# Patient Record
Sex: Male | Born: 1985 | Race: White | Hispanic: No | Marital: Married | Smoking: Never smoker
Health system: Southern US, Community
[De-identification: ages and names within clinical notes are randomized; demographics above are authoritative.]

## PROBLEM LIST (undated history)

## (undated) DIAGNOSIS — J45909 Unspecified asthma, uncomplicated: Secondary | ICD-10-CM

## (undated) DIAGNOSIS — E663 Overweight: Secondary | ICD-10-CM

## (undated) DIAGNOSIS — T7840XA Allergy, unspecified, initial encounter: Secondary | ICD-10-CM

## (undated) DIAGNOSIS — L989 Disorder of the skin and subcutaneous tissue, unspecified: Secondary | ICD-10-CM

## (undated) DIAGNOSIS — F419 Anxiety disorder, unspecified: Secondary | ICD-10-CM

## (undated) HISTORY — DX: Overweight: E66.3

## (undated) HISTORY — DX: Anxiety disorder, unspecified: F41.9

## (undated) HISTORY — DX: Unspecified asthma, uncomplicated: J45.909

## (undated) HISTORY — PX: HAND SURGERY: SHX662

## (undated) HISTORY — DX: Allergy, unspecified, initial encounter: T78.40XA

## (undated) HISTORY — DX: Disorder of the skin and subcutaneous tissue, unspecified: L98.9

---
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
## 2008-01-23 ENCOUNTER — Emergency Department (HOSPITAL_COMMUNITY): Admission: EM | Admit: 2008-01-23 | Discharge: 2008-01-23 | Payer: Self-pay | Admitting: Family Medicine

## 2009-11-04 IMAGING — CR DG ANKLE COMPLETE 3+V*L*
3 series · 3 of 3 positions shown · non-contrast
Comparison: None.

CLINICAL DATA: Injured left ankle.  Lateral pain and swelling.

LEFT ANKLE COMPLETE - 3+ VIEW 01/23/2008:

[view not recorded (1 of 3)]
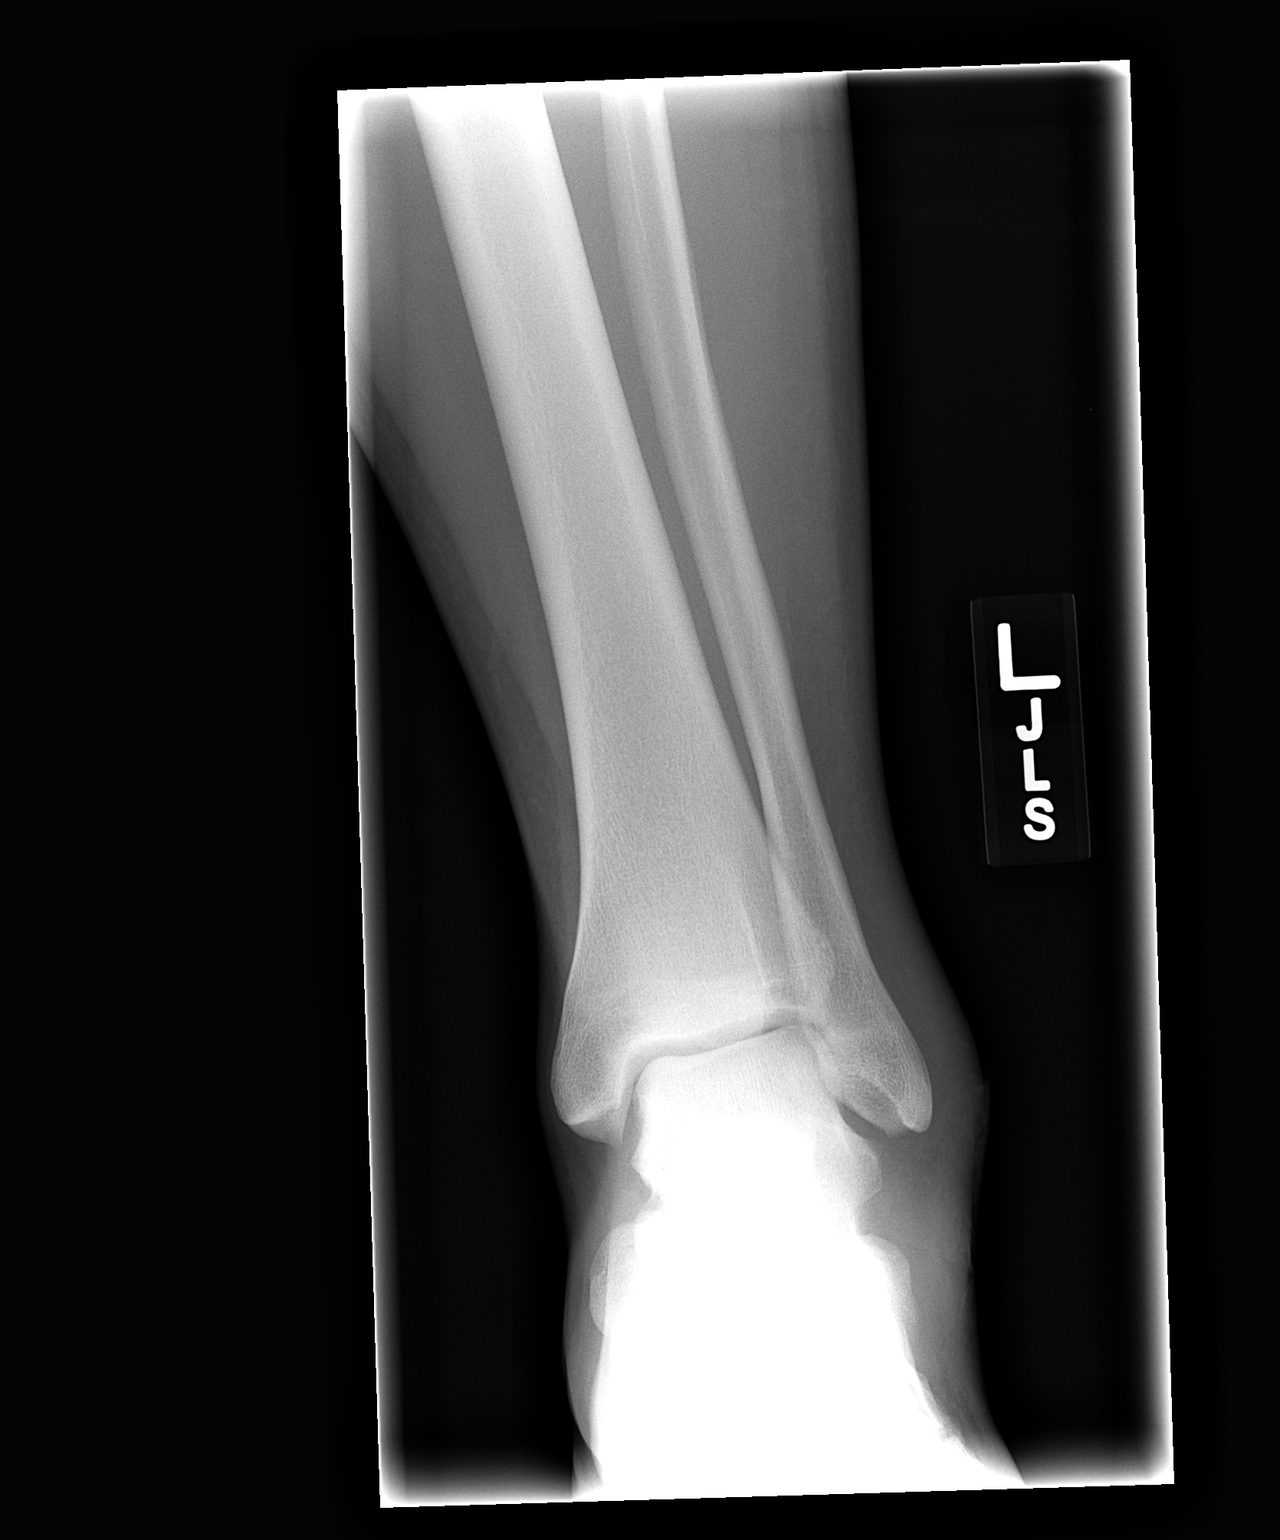

[view not recorded (2 of 3)]
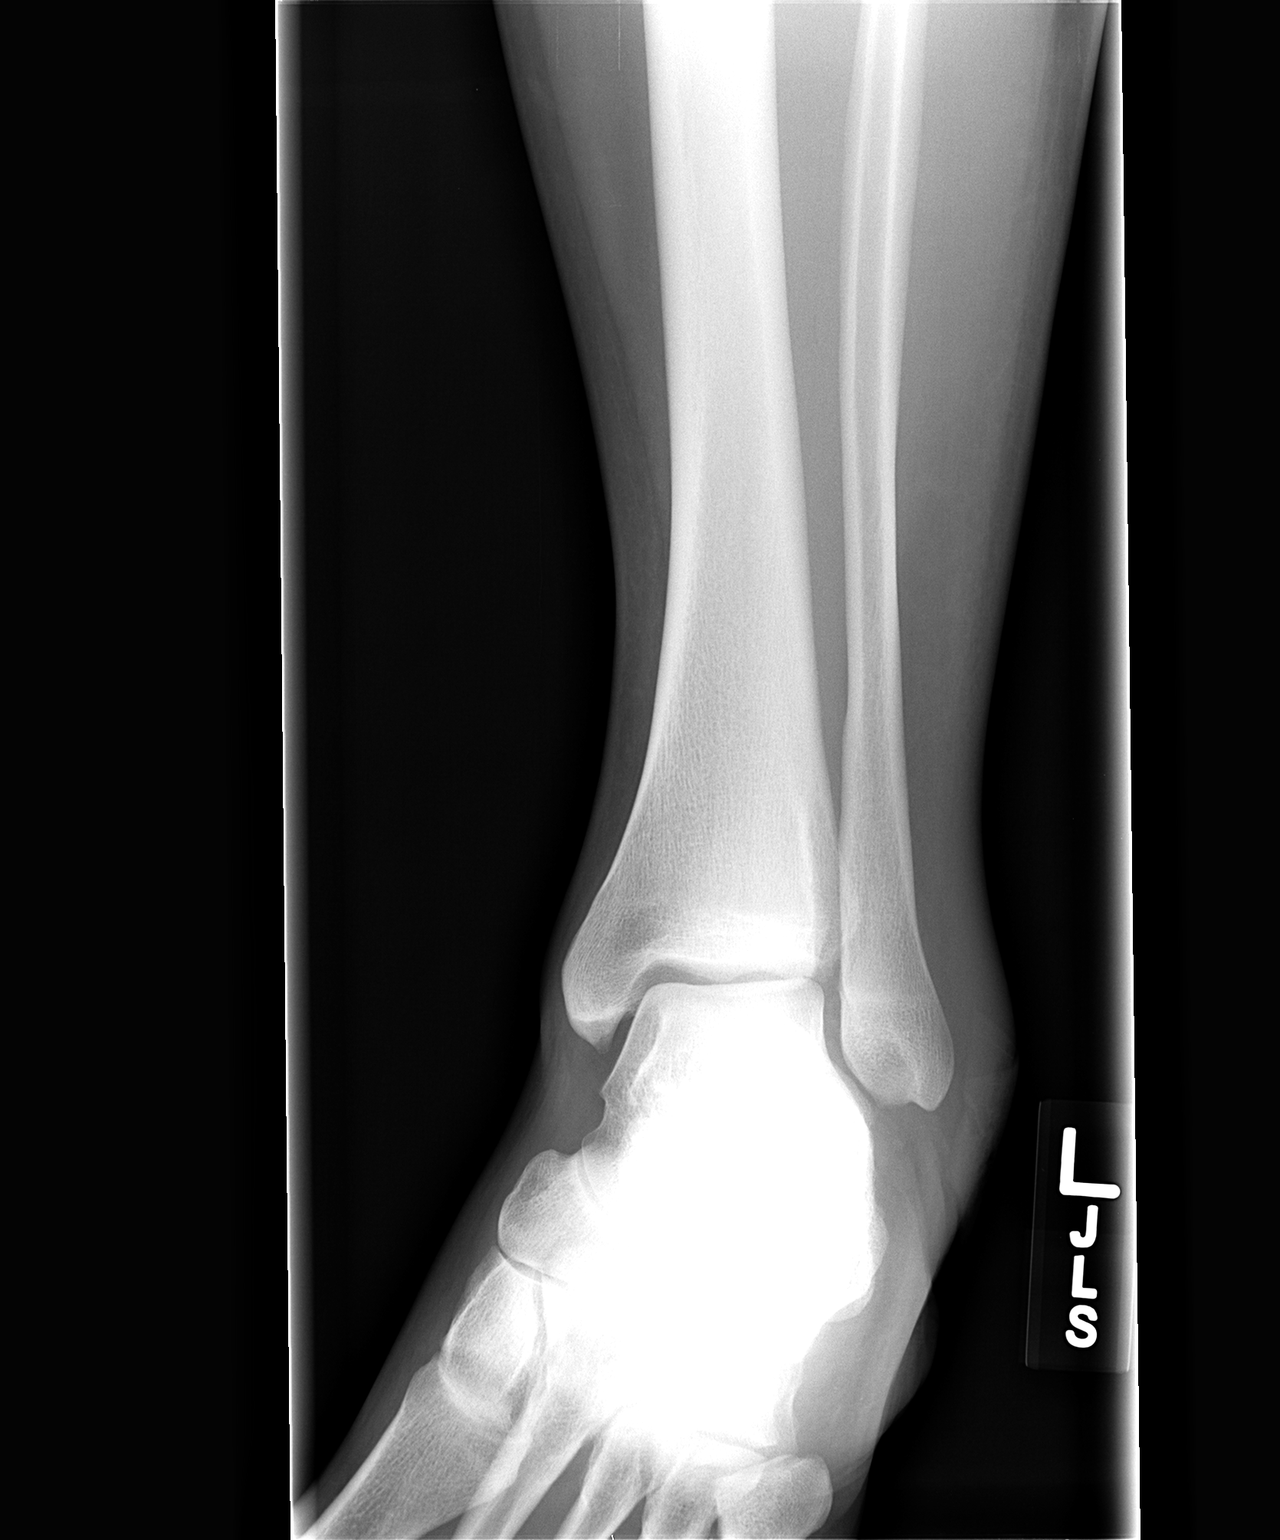

[view not recorded (3 of 3)]
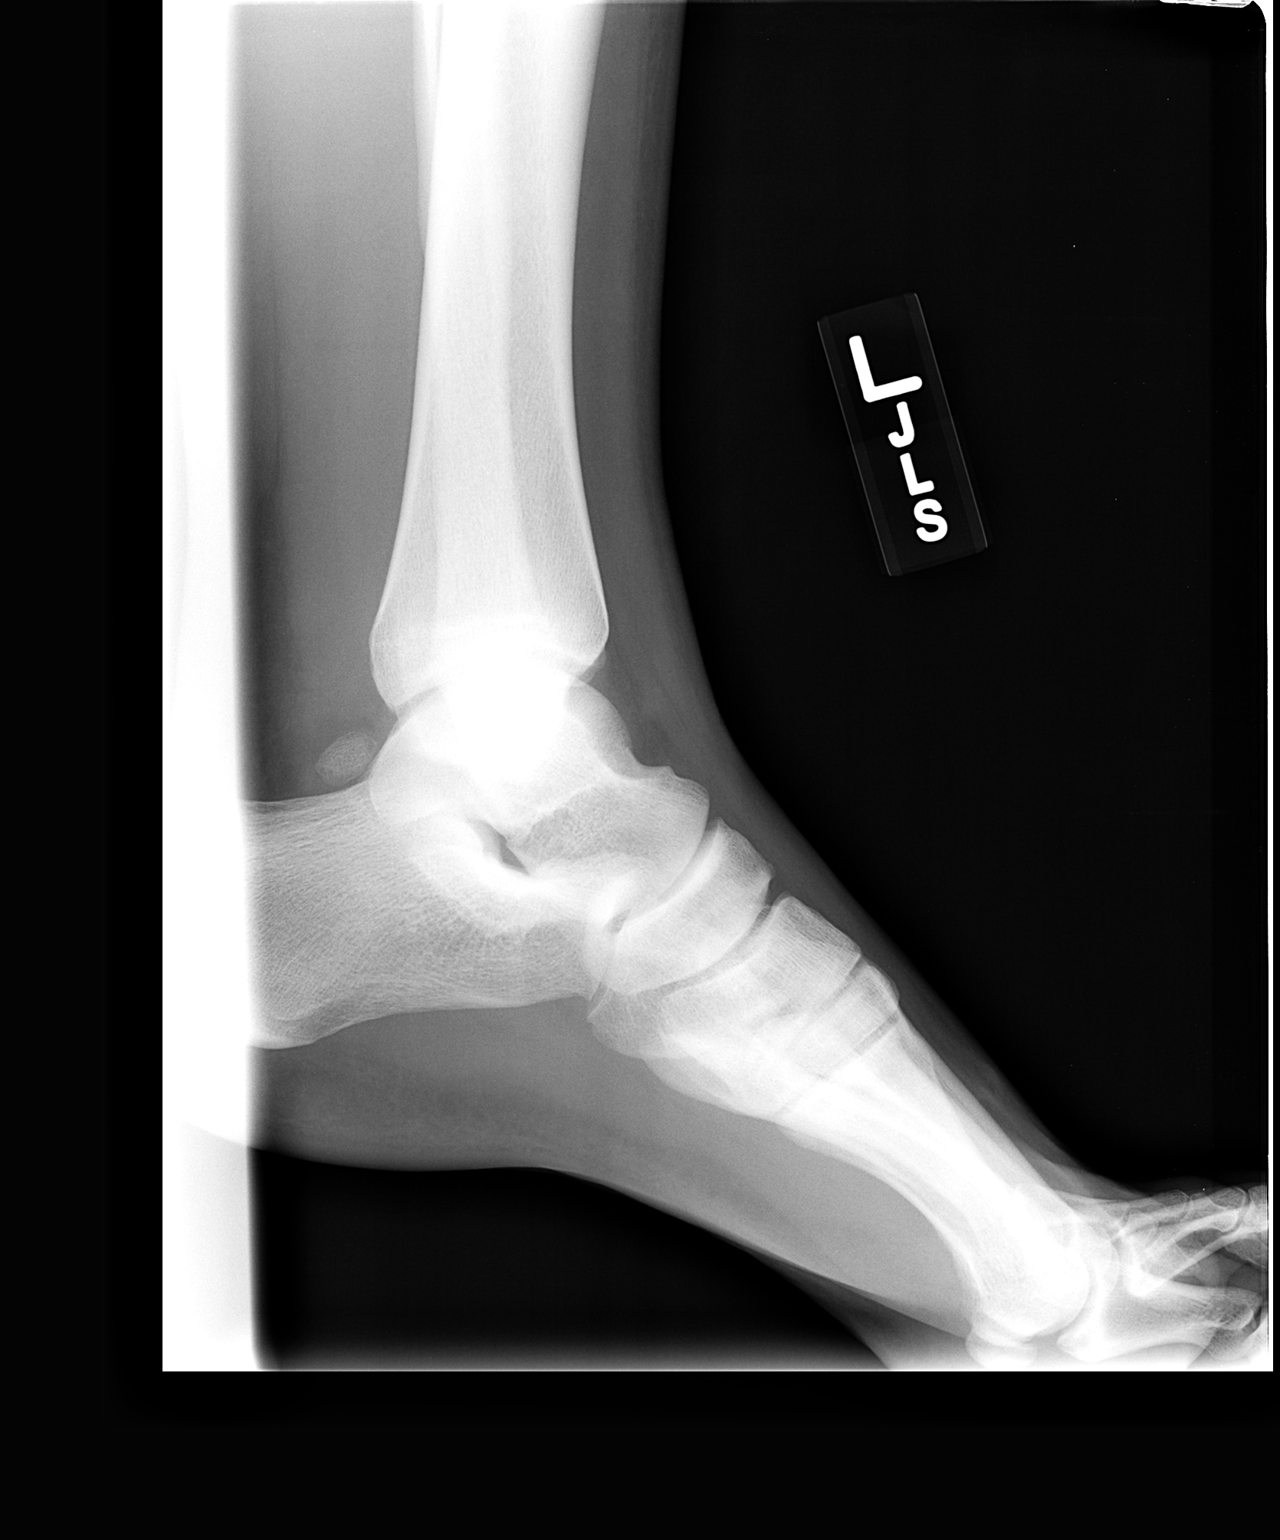

[3 of 3 positions shown; findings below may reference images not displayed]

FINDINGS: No evidence of acute fracture or dislocation.  Ankle
mortise intact with well-preserved joint space.  Accessory ossicle
posterior to the talus.  Lateral soft tissue swelling.  No evidence
of a significant joint effusion.
IMPRESSION: No acute skeletal abnormalities.  Lateral soft tissue swelling.

## 2010-10-11 NOTE — Op Note (Signed)
[place]  Patient:    Bruce Mckee, Bruce Mckee                        MRN: [id number] Proc. Date: 04/15/00 Adm. Date:  [id number] Attending:  Monica Becton                           Operative Report  REOPERATIVE DIAGNOSIS:  Phimosis.  POSTOPERATIVE DIAGNOSIS:  Phimosis.  OPERATION PERFORMED:  Circumcision.  SURGEON:  Claudette Laws, M.D.  ANESTHESIA:  DESCRIPTION OF PROCEDURE:  The patient was prepped in the supine position under LMA anesthesia.  A marking  pen was used to outline the incisions on the skin and the mucosal surfaces.  A knife blade was used to outline the incisions.  The foreskin was removed with Metzenbaum scissors leaving about 3 to 4 mm of the mucosa attached to the corona.  All bleeders were electrocoagulated.  Approximately 20 cc of a mixture of 0.25% plain Marcaine mixed with 1% plain Xylocaine was injected circumferentially for a penile block.  The shaft skin was then reapproximated to the mucosa with interrupted 3-0 chromic catgut.  Betadine ointment was applied to the incision along with circular gauze dressing and Coban dressing and he was taken back to the recovery room in satisfactory condition. DD:  04/15/00 TD:  04/15/00 Job: [id number]

## 2016-07-07 DIAGNOSIS — R509 Fever, unspecified: Secondary | ICD-10-CM | POA: Diagnosis not present

## 2016-07-26 DIAGNOSIS — J209 Acute bronchitis, unspecified: Secondary | ICD-10-CM | POA: Diagnosis not present

## 2016-10-04 DIAGNOSIS — J0100 Acute maxillary sinusitis, unspecified: Secondary | ICD-10-CM | POA: Diagnosis not present

## 2016-11-19 DIAGNOSIS — J40 Bronchitis, not specified as acute or chronic: Secondary | ICD-10-CM | POA: Diagnosis not present

## 2016-11-19 DIAGNOSIS — J329 Chronic sinusitis, unspecified: Secondary | ICD-10-CM | POA: Diagnosis not present

## 2016-11-19 DIAGNOSIS — Z6829 Body mass index (BMI) 29.0-29.9, adult: Secondary | ICD-10-CM | POA: Diagnosis not present

## 2017-08-28 DIAGNOSIS — J209 Acute bronchitis, unspecified: Secondary | ICD-10-CM | POA: Diagnosis not present

## 2017-08-28 DIAGNOSIS — Z6829 Body mass index (BMI) 29.0-29.9, adult: Secondary | ICD-10-CM | POA: Diagnosis not present

## 2017-08-28 DIAGNOSIS — Z1339 Encounter for screening examination for other mental health and behavioral disorders: Secondary | ICD-10-CM | POA: Diagnosis not present

## 2017-08-28 DIAGNOSIS — S058X2A Other injuries of left eye and orbit, initial encounter: Secondary | ICD-10-CM | POA: Diagnosis not present

## 2017-11-02 DIAGNOSIS — R109 Unspecified abdominal pain: Secondary | ICD-10-CM | POA: Diagnosis not present

## 2017-11-02 DIAGNOSIS — R1084 Generalized abdominal pain: Secondary | ICD-10-CM | POA: Diagnosis not present

## 2018-01-18 DIAGNOSIS — L578 Other skin changes due to chronic exposure to nonionizing radiation: Secondary | ICD-10-CM | POA: Diagnosis not present

## 2018-01-18 DIAGNOSIS — D225 Melanocytic nevi of trunk: Secondary | ICD-10-CM | POA: Diagnosis not present

## 2018-01-18 DIAGNOSIS — L720 Epidermal cyst: Secondary | ICD-10-CM | POA: Diagnosis not present

## 2018-01-18 DIAGNOSIS — L570 Actinic keratosis: Secondary | ICD-10-CM | POA: Diagnosis not present

## 2018-01-18 DIAGNOSIS — D2239 Melanocytic nevi of other parts of face: Secondary | ICD-10-CM | POA: Diagnosis not present

## 2018-02-24 DIAGNOSIS — Z6829 Body mass index (BMI) 29.0-29.9, adult: Secondary | ICD-10-CM | POA: Diagnosis not present

## 2018-02-24 DIAGNOSIS — J40 Bronchitis, not specified as acute or chronic: Secondary | ICD-10-CM | POA: Diagnosis not present

## 2018-06-19 DIAGNOSIS — J181 Lobar pneumonia, unspecified organism: Secondary | ICD-10-CM | POA: Diagnosis not present

## 2018-06-19 DIAGNOSIS — R05 Cough: Secondary | ICD-10-CM | POA: Diagnosis not present

## 2019-04-08 ENCOUNTER — Other Ambulatory Visit: Payer: Self-pay

## 2019-04-08 DIAGNOSIS — Z20822 Contact with and (suspected) exposure to covid-19: Secondary | ICD-10-CM

## 2019-04-11 LAB — NOVEL CORONAVIRUS, NAA: SARS-CoV-2, NAA: NOT DETECTED

## 2019-04-28 ENCOUNTER — Other Ambulatory Visit: Payer: Self-pay

## 2019-04-28 DIAGNOSIS — Z20822 Contact with and (suspected) exposure to covid-19: Secondary | ICD-10-CM

## 2019-04-29 DIAGNOSIS — H6591 Unspecified nonsuppurative otitis media, right ear: Secondary | ICD-10-CM | POA: Diagnosis not present

## 2019-04-29 DIAGNOSIS — Z20828 Contact with and (suspected) exposure to other viral communicable diseases: Secondary | ICD-10-CM | POA: Diagnosis not present

## 2019-05-01 LAB — NOVEL CORONAVIRUS, NAA: SARS-CoV-2, NAA: NOT DETECTED

## 2019-05-23 ENCOUNTER — Ambulatory Visit: Payer: BC Managed Care – PPO | Attending: Internal Medicine

## 2019-05-23 DIAGNOSIS — Z20822 Contact with and (suspected) exposure to covid-19: Secondary | ICD-10-CM

## 2019-05-23 DIAGNOSIS — Z20828 Contact with and (suspected) exposure to other viral communicable diseases: Secondary | ICD-10-CM | POA: Diagnosis not present

## 2019-05-24 DIAGNOSIS — H5712 Ocular pain, left eye: Secondary | ICD-10-CM | POA: Diagnosis not present

## 2019-05-25 LAB — NOVEL CORONAVIRUS, NAA: SARS-CoV-2, NAA: NOT DETECTED

## 2019-07-29 DIAGNOSIS — Z6831 Body mass index (BMI) 31.0-31.9, adult: Secondary | ICD-10-CM | POA: Diagnosis not present

## 2019-07-29 DIAGNOSIS — R079 Chest pain, unspecified: Secondary | ICD-10-CM | POA: Diagnosis not present

## 2019-07-29 DIAGNOSIS — D229 Melanocytic nevi, unspecified: Secondary | ICD-10-CM | POA: Diagnosis not present

## 2019-08-05 ENCOUNTER — Ambulatory Visit (INDEPENDENT_AMBULATORY_CARE_PROVIDER_SITE_OTHER): Payer: BC Managed Care – PPO | Admitting: Cardiology

## 2019-08-05 ENCOUNTER — Encounter: Payer: Self-pay | Admitting: Cardiology

## 2019-08-05 ENCOUNTER — Other Ambulatory Visit: Payer: Self-pay

## 2019-08-05 DIAGNOSIS — I319 Disease of pericardium, unspecified: Secondary | ICD-10-CM

## 2019-08-05 DIAGNOSIS — R0789 Other chest pain: Secondary | ICD-10-CM

## 2019-08-05 DIAGNOSIS — I300 Acute nonspecific idiopathic pericarditis: Secondary | ICD-10-CM

## 2019-08-05 HISTORY — DX: Disease of pericardium, unspecified: I31.9

## 2019-08-05 HISTORY — DX: Other chest pain: R07.89

## 2019-08-05 NOTE — Progress Notes (Signed)
Cardiology Consultation:    Date:  08/05/2019   ID:  Bruce Mckee, DOB 1986-03-05, MRN [id number]  PCP:  Angelina Sheriff, MD  Cardiologist:  Jenne Campus, MD   Referring MD: Angelina Sheriff, MD   No chief complaint on file.  chest pain  History of Present Illness:    Bruce Mckee is a 34 y.o. male who is being seen today for the evaluation of chest pain at the request of Angelina Sheriff, MD.  He is a young healthy gentleman.  For last few weeks has been experiencing chest pain.  Pain is atypical happening at rest lasting for about 2 hours and there is no shortness of breath no sweating associated with this sensation.  He blamed this on stressful life.  He is for history Freight forwarder.  He is working from required a lot of walking he has no difficulty doing it, however, lately he said when he was walking uphill he developed shortness of breath as well as some chest sensation.  He used to exercise on a regular basis but because of continued he does not do it anymore.  He does not smoke his cholesterol LDL is 93, HDL 56.  He does have only one family member his grandfather who ended up having myocardial infarction vision below 59.  Otherwise everybody is healthy.  Pain that he described in the chest that can last for hours taking deep breath coughing does not make it worse severity of the pain is about 3-4 in scale up to 10.  He was given alprazolam by his primary care physician with good relief.  History reviewed. No pertinent past medical history.  History reviewed. No pertinent surgical history.  Current Medications: Current Meds  Medication Sig  . ALPRAZolam (XANAX) 0.25 MG tablet Take 0.25 mg by mouth daily.     Allergies:   Amoxicillin and Doxycycline   Social History   Socioeconomic History  . Marital status: Single    Spouse name: Not on file  . Number of children: Not on file  . Years of education: Not on file  . Highest education level: Not on file    Occupational History  . Not on file  Tobacco Use  . Smoking status: Never Smoker  . Smokeless tobacco: Never Used  Substance and Sexual Activity  . Alcohol use: Not on file  . Drug use: Not on file  . Sexual activity: Not on file  Other Topics Concern  . Not on file  Social History Narrative  . Not on file   Social Determinants of Health   Financial Resource Strain:   . Difficulty of Paying Living Expenses:   Food Insecurity:   . Worried About Charity fundraiser in the Last Year:   . Arboriculturist in the Last Year:   Transportation Needs:   . Film/video editor (Medical):   Marland Kitchen Lack of Transportation (Non-Medical):   Physical Activity:   . Days of Exercise per Week:   . Minutes of Exercise per Session:   Stress:   . Feeling of Stress :   Social Connections:   . Frequency of Communication with Friends and Family:   . Frequency of Social Gatherings with Friends and Family:   . Attends Religious Services:   . Active Member of Clubs or Organizations:   . Attends Archivist Meetings:   Marland Kitchen Marital Status:      Family History: The patient's family  history includes Heart disease in his maternal grandfather; Heart failure in his maternal grandfather. ROS:   Please see the history of present illness.    All 14 point review of systems negative except as described per history of present illness.  EKGs/Labs/Other Studies Reviewed:    The following studies were reviewed today:   EKG:  EKG is  ordered today.  The ekg ordered today demonstrates normal sinus rhythm, normal PR interval, ST segment abnormalities indicating either early repolarization or possibly pericarditis.  Recent Labs: No results found for requested labs within last 8760 hours.  Recent Lipid Panel No results found for: CHOL, TRIG, HDL, CHOLHDL, VLDL, LDLCALC, LDLDIRECT  Physical Exam:    VS:  BP 122/78   Pulse 60   Ht 6\' 1"  (1.854 m)   Wt 239 lb 6.4 oz (108.6 kg)   SpO2 100%   BMI 31.59  kg/m     Wt Readings from Last 3 Encounters:  08/05/19 239 lb 6.4 oz (108.6 kg)     GEN:  Well nourished, well developed in no acute distress HEENT: Normal NECK: No JVD; No carotid bruits LYMPHATICS: No lymphadenopathy CARDIAC: RRR, no murmurs, no rubs, no gallops RESPIRATORY:  Clear to auscultation without rales, wheezing or rhonchi  ABDOMEN: Soft, non-tender, non-distended MUSCULOSKELETAL:  No edema; No deformity  SKIN: Warm and dry NEUROLOGIC:  Alert and oriented x 3 PSYCHIATRIC:  Normal affect   ASSESSMENT:    1. Atypical chest pain   2. Acute idiopathic pericarditis    PLAN:    In order of problems listed above:  1. Atypical chest pain.  Lasting for hours EKG showing some nonspecific changes possibly pericarditis.  Plan will be I will ask him to have sedimentation rate as well as C-reactive protein done if those tests are abnormal then we treat him as pericarditis.  If those tests are normal then will pursue ischemia work-up however, overall have low suspicion that this is contributing to his symptoms status.  I suspect anxiety plays some role here he is being given already anxiolytic in form of alprazolam which helps some.  If C-reactive protein and sed rate is negative then will pursue stress testing. 2. Possibly pericarditis.  I am not sure about the diagnosis yet.  Sed rate and C-reactive protein will be done. 3. Cholesterol profile reviewed with him.  LDL 93 HDL 56.  Needs a decent cholesterol profile.   Medication Adjustments/Labs and Tests Ordered: Current medicines are reviewed at length with the patient today.  Concerns regarding medicines are outlined above.  No orders of the defined types were placed in this encounter.  No orders of the defined types were placed in this encounter.   Signed, 10/05/19, MD, [facility]. 08/05/2019 9:22 AM    [facility]

## 2019-08-05 NOTE — Addendum Note (Signed)
Addended by: Lita Mains on: 08/05/2019 09:33 AM   Modules accepted: Orders

## 2019-08-05 NOTE — Patient Instructions (Signed)
Medication Instructions:  Your physician recommends that you continue on your current medications as directed. Please refer to the Current Medication list given to you today.  *If you need a refill on your cardiac medications before your next appointment, please call your pharmacy*   Lab Work: Your physician recommends that you return for lab work today: bmp, sed rate, crp    If you have labs (blood work) drawn today and your tests are completely normal, you will receive your results only by: Marland Kitchen MyChart Message (if you have MyChart) OR . A paper copy in the mail If you have any lab test that is abnormal or we need to change your treatment, we will call you to review the results.   Testing/Procedures: None    Follow-Up: At [facility], you and your health needs are our priority.  As part of our continuing mission to provide you with exceptional heart care, we have created designated Provider Care Teams.  These Care Teams include your primary Cardiologist (physician) and Advanced Practice Providers (APPs -  Physician Assistants and Nurse Practitioners) who all work together to provide you with the care you need, when you need it.  We recommend signing up for the patient portal called "MyChart".  Sign up information is provided on this After Visit Summary.  MyChart is used to connect with patients for Virtual Visits (Telemedicine).  Patients are able to view lab/test results, encounter notes, upcoming appointments, etc.  Non-urgent messages can be sent to your provider as well.   To learn more about what you can do with MyChart, go to [email or web address].    Your next appointment:   3 week(s)  The format for your next appointment:   In Person  Provider:   Gypsy Balsam, MD   Other Instructions

## 2019-08-06 LAB — COMPREHENSIVE METABOLIC PANEL
ALT: 22 IU/L (ref 0–44)
AST: 23 IU/L (ref 0–40)
Albumin/Globulin Ratio: 2.5 — ABNORMAL HIGH (ref 1.2–2.2)
Albumin: 4.7 g/dL (ref 4.0–5.0)
Alkaline Phosphatase: 52 IU/L (ref 39–117)
BUN/Creatinine Ratio: 10 (ref 9–20)
BUN: 10 mg/dL (ref 6–20)
Bilirubin Total: 0.6 mg/dL (ref 0.0–1.2)
CO2: 22 mmol/L (ref 20–29)
Calcium: 9.5 mg/dL (ref 8.7–10.2)
Chloride: 100 mmol/L (ref 96–106)
Creatinine, Ser: 1.01 mg/dL (ref 0.76–1.27)
GFR calc Af Amer: 112 mL/min/{1.73_m2} (ref >59)
GFR calc non Af Amer: 97 mL/min/{1.73_m2} (ref >59)
Globulin, Total: 1.9 g/dL (ref 1.5–4.5)
Glucose: 91 mg/dL (ref 65–99)
Potassium: 5 mmol/L (ref 3.5–5.2)
Sodium: 137 mmol/L (ref 134–144)
Total Protein: 6.6 g/dL (ref 6.0–8.5)

## 2019-08-06 LAB — HIGH SENSITIVITY CRP: CRP, High Sensitivity: 0.72 mg/L (ref 0.00–3.00)

## 2019-08-06 LAB — SEDIMENTATION RATE: Sed Rate: 2 mm/hr (ref 0–15)

## 2019-08-08 ENCOUNTER — Telehealth: Payer: Self-pay | Admitting: Emergency Medicine

## 2019-08-08 NOTE — Telephone Encounter (Signed)
Left message for patient to return call regarding test results.  

## 2019-08-09 ENCOUNTER — Telehealth: Payer: Self-pay | Admitting: Cardiology

## 2019-08-09 DIAGNOSIS — R0789 Other chest pain: Secondary | ICD-10-CM

## 2019-08-09 NOTE — Telephone Encounter (Signed)
New Message  Patient called back for his results.   Please call

## 2019-08-09 NOTE — Telephone Encounter (Signed)
Called patient back informed him of his test results and that Dr. Bing Matter advised him to have a treadmill stress test. Will arrange this for him in [place] and call with appointment tomorrow since schedulers are gone for the day. Patient verbally understands. No further questions.

## 2019-08-10 NOTE — Telephone Encounter (Signed)
Patient was scheduled for exercise tolerance test on 08/16/2019 at 330 pm. Left message for him to return call to give him this information as well as instructions and get him scheduled for a covid test.

## 2019-08-12 ENCOUNTER — Telehealth (HOSPITAL_COMMUNITY): Payer: Self-pay

## 2019-08-12 NOTE — Telephone Encounter (Signed)
Called patient. He had to cancel his exercise tolerance test for now because he can't miss work to be tested for covid before the test right now. He told me he will call us back when he is able to schedule it.

## 2019-08-12 NOTE — Telephone Encounter (Signed)
Encounter complete. 

## 2019-08-16 ENCOUNTER — Ambulatory Visit (HOSPITAL_COMMUNITY)
Admission: RE | Admit: 2019-08-16 | Payer: BC Managed Care – PPO | Source: Ambulatory Visit | Attending: Cardiology | Admitting: Cardiology

## 2019-08-25 DIAGNOSIS — Z1331 Encounter for screening for depression: Secondary | ICD-10-CM | POA: Diagnosis not present

## 2019-08-25 DIAGNOSIS — R0789 Other chest pain: Secondary | ICD-10-CM | POA: Diagnosis not present

## 2019-08-25 DIAGNOSIS — Z6831 Body mass index (BMI) 31.0-31.9, adult: Secondary | ICD-10-CM | POA: Diagnosis not present

## 2019-08-25 DIAGNOSIS — F419 Anxiety disorder, unspecified: Secondary | ICD-10-CM | POA: Diagnosis not present

## 2019-08-26 ENCOUNTER — Ambulatory Visit: Payer: BC Managed Care – PPO | Admitting: Cardiology

## 2019-09-06 ENCOUNTER — Telehealth: Payer: Self-pay | Admitting: Emergency Medicine

## 2019-09-06 NOTE — Telephone Encounter (Signed)
It is never absolutely necessary, will hold off with it and ask him to let us know if have more cp

## 2019-09-06 NOTE — Telephone Encounter (Signed)
Per Dr. Bing Matter doesn't need follow up if he doesn't want to. Will inform patient of this. Left message for patient to return call.

## 2019-09-06 NOTE — Telephone Encounter (Signed)
Called patient informed him per Dr. Bing Matter he doesn't have to have lexiscan. I canceled this for him and his covid test appointment that he had as well. Patient informed if he had anymore chest pain to let us know. He wants to know if Dr. Bing Matter still needs follow up appointment in May 2021. Will consult with Dr. Bing Matter.

## 2019-09-06 NOTE — Telephone Encounter (Signed)
Patient called and reports he is not going to be able to afford the upcoming stress test next week he doesn't think his insurance is going to cover it and he doesn't want to "rack up debt" if he doesn't have to. He denies having chest pain anymore and wanted to know if Dr. Bing Matter thinks he absolutely needs to stress test or not. If he can cancel he would prefer unless it is recommended otherwise.

## 2019-09-09 ENCOUNTER — Other Ambulatory Visit (HOSPITAL_COMMUNITY): Payer: BC Managed Care – PPO

## 2019-09-12 NOTE — Telephone Encounter (Signed)
No answer on mobile number

## 2019-09-13 ENCOUNTER — Encounter (HOSPITAL_COMMUNITY): Payer: BC Managed Care – PPO

## 2019-09-20 NOTE — Telephone Encounter (Signed)
Called patient and confirmed that he still wanted to cancel his upcoming with Dr. Bing Matter. He said yes. Appointment cancelled.

## 2019-09-30 ENCOUNTER — Ambulatory Visit: Payer: BC Managed Care – PPO | Admitting: Cardiology

## 2019-10-10 DIAGNOSIS — L720 Epidermal cyst: Secondary | ICD-10-CM | POA: Diagnosis not present

## 2019-10-10 DIAGNOSIS — D225 Melanocytic nevi of trunk: Secondary | ICD-10-CM | POA: Diagnosis not present

## 2019-10-10 DIAGNOSIS — L814 Other melanin hyperpigmentation: Secondary | ICD-10-CM | POA: Diagnosis not present

## 2020-01-06 DIAGNOSIS — Z3009 Encounter for other general counseling and advice on contraception: Secondary | ICD-10-CM | POA: Diagnosis not present

## 2020-01-26 DIAGNOSIS — J329 Chronic sinusitis, unspecified: Secondary | ICD-10-CM | POA: Diagnosis not present

## 2020-01-26 DIAGNOSIS — J40 Bronchitis, not specified as acute or chronic: Secondary | ICD-10-CM | POA: Diagnosis not present

## 2020-01-26 DIAGNOSIS — J302 Other seasonal allergic rhinitis: Secondary | ICD-10-CM | POA: Diagnosis not present

## 2020-02-24 DIAGNOSIS — Z302 Encounter for sterilization: Secondary | ICD-10-CM | POA: Diagnosis not present

## 2020-05-28 DIAGNOSIS — U071 COVID-19: Secondary | ICD-10-CM | POA: Diagnosis not present

## 2020-05-28 DIAGNOSIS — Z20822 Contact with and (suspected) exposure to covid-19: Secondary | ICD-10-CM | POA: Diagnosis not present

## 2020-10-10 DIAGNOSIS — R0602 Shortness of breath: Secondary | ICD-10-CM | POA: Diagnosis not present

## 2020-10-10 DIAGNOSIS — R0781 Pleurodynia: Secondary | ICD-10-CM | POA: Diagnosis not present

## 2020-10-10 DIAGNOSIS — R079 Chest pain, unspecified: Secondary | ICD-10-CM | POA: Diagnosis not present

## 2021-03-13 DIAGNOSIS — J40 Bronchitis, not specified as acute or chronic: Secondary | ICD-10-CM | POA: Diagnosis not present

## 2021-03-13 DIAGNOSIS — Z20828 Contact with and (suspected) exposure to other viral communicable diseases: Secondary | ICD-10-CM | POA: Diagnosis not present

## 2021-03-13 DIAGNOSIS — J329 Chronic sinusitis, unspecified: Secondary | ICD-10-CM | POA: Diagnosis not present

## 2021-07-17 DIAGNOSIS — J40 Bronchitis, not specified as acute or chronic: Secondary | ICD-10-CM | POA: Diagnosis not present

## 2021-07-17 DIAGNOSIS — J028 Acute pharyngitis due to other specified organisms: Secondary | ICD-10-CM | POA: Diagnosis not present

## 2021-07-22 DIAGNOSIS — H1031 Unspecified acute conjunctivitis, right eye: Secondary | ICD-10-CM | POA: Diagnosis not present

## 2021-10-07 DIAGNOSIS — R051 Acute cough: Secondary | ICD-10-CM | POA: Diagnosis not present

## 2021-10-07 DIAGNOSIS — J9801 Acute bronchospasm: Secondary | ICD-10-CM | POA: Diagnosis not present

## 2021-10-07 DIAGNOSIS — J019 Acute sinusitis, unspecified: Secondary | ICD-10-CM | POA: Diagnosis not present

## 2022-04-01 DIAGNOSIS — D235 Other benign neoplasm of skin of trunk: Secondary | ICD-10-CM | POA: Diagnosis not present

## 2022-04-01 DIAGNOSIS — L720 Epidermal cyst: Secondary | ICD-10-CM | POA: Diagnosis not present

## 2022-04-01 DIAGNOSIS — D225 Melanocytic nevi of trunk: Secondary | ICD-10-CM | POA: Diagnosis not present

## 2022-04-01 DIAGNOSIS — B078 Other viral warts: Secondary | ICD-10-CM | POA: Diagnosis not present

## 2022-04-01 DIAGNOSIS — L738 Other specified follicular disorders: Secondary | ICD-10-CM | POA: Diagnosis not present

## 2022-11-13 ENCOUNTER — Ambulatory Visit: Payer: BC Managed Care – PPO | Attending: Cardiology | Admitting: Cardiology

## 2022-11-13 ENCOUNTER — Ambulatory Visit: Payer: BC Managed Care – PPO | Attending: Cardiology

## 2022-11-13 ENCOUNTER — Encounter: Payer: Self-pay | Admitting: Cardiology

## 2022-11-13 VITALS — BP 122/80 | HR 65 | Ht 73.0 in | Wt 245.2 lb

## 2022-11-13 DIAGNOSIS — R0789 Other chest pain: Secondary | ICD-10-CM

## 2022-11-13 DIAGNOSIS — I300 Acute nonspecific idiopathic pericarditis: Secondary | ICD-10-CM | POA: Diagnosis not present

## 2022-11-13 DIAGNOSIS — R0609 Other forms of dyspnea: Secondary | ICD-10-CM

## 2022-11-13 HISTORY — DX: Other forms of dyspnea: R06.09

## 2022-11-13 LAB — EXERCISE TOLERANCE TEST
Angina Index: 0
Estimated workload: 13.4
Exercise duration (min): 12 min
Exercise duration (sec): 0 s
MPHR: 183 {beats}/min
Peak HR: 173 {beats}/min
Percent HR: 94 %
Rest HR: 64 {beats}/min

## 2022-11-13 NOTE — Addendum Note (Signed)
Addended by: Roxanne Mins I on: 11/13/2022 09:30 AM   Modules accepted: Orders

## 2022-11-13 NOTE — Progress Notes (Signed)
Cardiology Consultation:    Date:  11/13/2022   ID:  Bruce Mckee, DOB 12-01-1985, MRN [id number]  PCP:  Noni Saupe, MD  Cardiologist:  Gypsy Balsam, MD   Referring MD: Noni Saupe, MD   No chief complaint on file. Not having chest pain  History of Present Illness:    Bruce Mckee is a 37 y.o. male who is being seen today for the evaluation of chest pain at the request of Arvin Collard II, MD. I see him more than 3 years ago the reason for that visit at that time was for chest pain.  Pain got some pleuritic characteristic and at that time we decided to dissemination rate as well as high-sensitivity C-reactive protein with intention if it will be abnormal we will treat as pericarditis if no ischemia workup will be done.  All test came negative he had normal inflammatory markers he was scheduled to have a stress test but never happened.  He comes back to our office to be established as a patient again.  He complained of having some chest heaviness that happen typically nervous situation.  He is working required walking he have no difficulty walking climbing stairs but just concerned about his symptoms and would like to be checked.  There is no palpitation no swelling of lower extremities overall he is look like a young healthy man.  Does not smoke does not know what his cholesterol is does have family history of premature coronary artery disease  History reviewed. No pertinent past medical history.  History reviewed. No pertinent surgical history.  Current Medications: No outpatient medications have been marked as taking for the 11/13/22 encounter (Office Visit) with Georgeanna Lea, MD.     Allergies:   Amoxicillin and Doxycycline   Social History   Socioeconomic History   Marital status: Single    Spouse name: Not on file   Number of children: Not on file   Years of education: Not on file   Highest education level: Not on file  Occupational History    Not on file  Tobacco Use   Smoking status: Never   Smokeless tobacco: Never  Substance and Sexual Activity   Alcohol use: Not on file   Drug use: Not on file   Sexual activity: Not on file  Other Topics Concern   Not on file  Social History Narrative   Not on file   Social Determinants of Health   Financial Resource Strain: Not on file  Food Insecurity: Not on file  Transportation Needs: Not on file  Physical Activity: Not on file  Stress: Not on file  Social Connections: Not on file     Family History: The patient's family history includes Heart disease in his maternal grandfather; Heart failure in his maternal grandfather. ROS:   Please see the history of present illness.    All 14 point review of systems negative except as described per history of present illness.  EKGs/Labs/Other Studies Reviewed:    The following studies were reviewed today:   EKG:  EKG is  ordered today.  The ekg ordered today demonstrates normal sinus rhythm, normal P interval, normal QS complex duration morphology no ST segment changes  Recent Labs: No results found for requested labs within last 365 days.  Recent Lipid Panel No results found for: "CHOL", "TRIG", "HDL", "CHOLHDL", "VLDL", "LDLCALC", "LDLDIRECT"  Physical Exam:    VS:  BP 122/80 (BP Location: Right Arm, Patient  Position: Sitting, Cuff Size: Normal)   Pulse 65   Ht 6\' 1"  (1.854 m)   Wt 245 lb 3.2 oz (111.2 kg)   SpO2 98%   BMI 32.35 kg/m     Wt Readings from Last 3 Encounters:  11/13/22 245 lb 3.2 oz (111.2 kg)  08/05/19 239 lb 6.4 oz (108.6 kg)     GEN:  Well nourished, well developed in no acute distress HEENT: Normal NECK: No JVD; No carotid bruits LYMPHATICS: No lymphadenopathy CARDIAC: RRR, no murmurs, no rubs, no gallops RESPIRATORY:  Clear to auscultation without rales, wheezing or rhonchi  ABDOMEN: Soft, non-tender, non-distended MUSCULOSKELETAL:  No edema; No deformity  SKIN: Warm and dry NEUROLOGIC:   Alert and oriented x 3 PSYCHIATRIC:  Normal affect   ASSESSMENT:    1. Atypical chest pain   2. Dyspnea on exertion   3. Acute idiopathic pericarditis    PLAN:    In order of problems listed above:  Atypical chest pain.  Does have some worrisome characteristics.  Therefore, I think will be reasonable to do stress test.  I will schedule him to have plan EKG treadmill stress test. Overall risk assessment will do fasting lipid profile as well as LP(a).  We did spend great of time talking about need to exercise on the regular basis I recommended at least 5 times a week at least 30 minutes moderate intensity exercise, we also talk about basic of Mediterranean diet. History of pericarditis doubt this diagnosis he is inflammatory markers me years ago were negative so we will take away this diagnosis. [place] exertion overall very mild we will see how he will do on the treadmill.   Medication Adjustments/Labs and Tests Ordered: Current medicines are reviewed at length with the patient today.  Concerns regarding medicines are outlined above.  No orders of the defined types were placed in this encounter.  No orders of the defined types were placed in this encounter.   Signed, Georgeanna Lea, MD, [facility] 11/13/2022 9:12 AM    [facility]

## 2022-11-13 NOTE — Patient Instructions (Signed)
Medication Instructions:  Your physician recommends that you continue on your current medications as directed. Please refer to the Current Medication list given to you today.  *If you need a refill on your cardiac medications before your next appointment, please call your pharmacy*   Lab Work: Your physician recommends that you return for lab work in:   Labs today: Lipids, LPa  If you have labs (blood work) drawn today and your tests are completely normal, you will receive your results only by: MyChart Message (if you have MyChart) OR A paper copy in the mail If you have any lab test that is abnormal or we need to change your treatment, we will call you to review the results.   Testing/Procedures: Regular exercise stress test  Follow-Up: At [facility], you and your health needs are our priority.  As part of our continuing mission to provide you with exceptional heart care, we have created designated Provider Care Teams.  These Care Teams include your primary Cardiologist (physician) and Advanced Practice Providers (APPs -  Physician Assistants and Nurse Practitioners) who all work together to provide you with the care you need, when you need it.  We recommend signing up for the patient portal called "MyChart".  Sign up information is provided on this After Visit Summary.  MyChart is used to connect with patients for Virtual Visits (Telemedicine).  Patients are able to view lab/test results, encounter notes, upcoming appointments, etc.  Non-urgent messages can be sent to your provider as well.   To learn more about what you can do with MyChart, go to [email or web address].    Your next appointment:   2 month(s)  Provider:   Gypsy Balsam, MD    Other Instructions None

## 2022-11-15 LAB — LIPID PANEL
Chol/HDL Ratio: 3.7 ratio (ref 0.0–5.0)
Cholesterol, Total: 165 mg/dL (ref 100–199)
HDL: 45 mg/dL (ref >39)
LDL Chol Calc (NIH): 103 mg/dL — ABNORMAL HIGH (ref 0–99)
Triglycerides: 89 mg/dL (ref 0–149)
VLDL Cholesterol Cal: 17 mg/dL (ref 5–40)

## 2022-11-15 LAB — LIPOPROTEIN A (LPA): Lipoprotein (a): 23.4 nmol/L (ref <75.0)

## 2022-11-21 ENCOUNTER — Telehealth: Payer: Self-pay | Admitting: Cardiology

## 2022-11-21 ENCOUNTER — Telehealth: Payer: Self-pay

## 2022-11-21 NOTE — Telephone Encounter (Signed)
-----   Message from Georgeanna Lea, MD sent at 11/21/2022  9:32 AM EDT ----- Cholesterol mildly elevated but acceptable will wait for results of stress test

## 2022-11-21 NOTE — Telephone Encounter (Signed)
Patient notified of results.

## 2022-11-21 NOTE — Telephone Encounter (Signed)
Pt returning call regarding results. Please advise 

## 2022-11-25 DIAGNOSIS — B9689 Other specified bacterial agents as the cause of diseases classified elsewhere: Secondary | ICD-10-CM | POA: Diagnosis not present

## 2022-11-25 DIAGNOSIS — J329 Chronic sinusitis, unspecified: Secondary | ICD-10-CM | POA: Diagnosis not present

## 2023-01-12 NOTE — Progress Notes (Unsigned)
  Cardiology Office Note:  .   Date:  01/13/2023  ID:  [name], DOB 08-10-1985, MRN [id number] PCP: Noni Saupe, MD  [facility] Providers Cardiologist:  None    History of Present Illness: .   Bruce Mckee is a 37 y.o. male with a past medical history of atypical chest pain  Evaluated by Dr. Bing Matter on 11/13/2022, he continued to have some atypical type chest pain.  An exercise tolerance test was arranged which was negative for ischemia.  Lipoprotein a was obtained 23.4, lipid profile revealed LDL elevated at 103.  He presents today for follow-up of his test results.  He offers no formal complaints, has not had any further episodes of chest pain.  He feels like he was under a high level of stress when he was experiencing the pain, no further recurrence.  We talked about his cholesterol, overall it looks great, LDL is mildly elevated and we discussed ways he could get this down with diet and exercise. He denies chest pain, palpitations, dyspnea, pnd, orthopnea, n, v, dizziness, syncope, edema, weight gain, or early satiety.   ROS: Review of Systems  All other systems reviewed and are negative.    Studies Reviewed: .        Cardiac Studies & Procedures     STRESS TESTS  EXERCISE TOLERANCE TEST (ETT) 11/13/2022  Narrative   ETT negative for evidence of inducible ischemia. Good exercise capacity.              Risk Assessment/Calculations:     HYPERTENSION CONTROL Vitals:   01/13/23 0848 01/13/23 0912  BP: (!) 120/94 (!) 120/94    The patient's blood pressure is elevated above target today.  In order to address the patient's elevated BP: Blood pressure will be monitored at home to determine if medication changes need to be made.          Physical Exam:   VS:  BP (!) 120/94   Pulse 60   Ht 6\' 1"  (1.854 m)   Wt 252 lb (114.3 kg)   SpO2 99%   BMI 33.25 kg/m    Wt Readings from Last 3 Encounters:  01/13/23 252 lb (114.3 kg)  11/13/22 245 lb 3.2 oz  (111.2 kg)  08/05/19 239 lb 6.4 oz (108.6 kg)    GEN: Well nourished, well developed in no acute distress NECK: No JVD; No carotid bruits CARDIAC: RRR, no murmurs, rubs, gallops RESPIRATORY:  Clear to auscultation without rales, wheezing or rhonchi  ABDOMEN: Soft, non-tender, non-distended EXTREMITIES:  No edema; No deformity   ASSESSMENT AND PLAN: .   Atypical chest pain -patient feels it was most likely related to high level of stress that was going on, currently quiescent.  ETT was negative.  Lipoprotein a was 23. Dyslipidemia - cholesterol looks good, LDL is mildly elevated, however he can easily get this to the desired range with lifestyle modifications.          Dispo: Follow up PRN.   Signed, Flossie Dibble, NP

## 2023-01-13 ENCOUNTER — Ambulatory Visit: Payer: BC Managed Care – PPO | Attending: Cardiology | Admitting: Cardiology

## 2023-01-13 ENCOUNTER — Encounter: Payer: Self-pay | Admitting: *Deleted

## 2023-01-13 VITALS — BP 120/94 | HR 60 | Ht 73.0 in | Wt 252.0 lb

## 2023-01-13 DIAGNOSIS — R0789 Other chest pain: Secondary | ICD-10-CM | POA: Diagnosis not present

## 2023-01-13 DIAGNOSIS — E782 Mixed hyperlipidemia: Secondary | ICD-10-CM

## 2023-01-13 NOTE — Patient Instructions (Signed)
Medication Instructions:  Your physician recommends that you continue on your current medications as directed. Please refer to the Current Medication list given to you today.  *If you need a refill on your cardiac medications before your next appointment, please call your pharmacy*   Lab Work: None Ordered If you have labs (blood work) drawn today and your tests are completely normal, you will receive your results only by: MyChart Message (if you have MyChart) OR A paper copy in the mail If you have any lab test that is abnormal or we need to change your treatment, we will call you to review the results.   Testing/Procedures: None Ordered   Follow-Up: At [facility], you and your health needs are our priority.  As part of our continuing mission to provide you with exceptional heart care, we have created designated Provider Care Teams.  These Care Teams include your primary Cardiologist (physician) and Advanced Practice Providers (APPs -  Physician Assistants and Nurse Practitioners) who all work together to provide you with the care you need, when you need it.  We recommend signing up for the patient portal called "MyChart".  Sign up information is provided on this After Visit Summary.  MyChart is used to connect with patients for Virtual Visits (Telemedicine).  Patients are able to view lab/test results, encounter notes, upcoming appointments, etc.  Non-urgent messages can be sent to your provider as well.   To learn more about what you can do with MyChart, go to [email or web address].    Your next appointment:   As Needed  The format for your next appointment:   In Person  Provider:   Wallis Bamberg, NP   Other Instructions NA

## 2023-04-02 DIAGNOSIS — D229 Melanocytic nevi, unspecified: Secondary | ICD-10-CM | POA: Diagnosis not present

## 2023-04-02 DIAGNOSIS — D225 Melanocytic nevi of trunk: Secondary | ICD-10-CM | POA: Diagnosis not present

## 2023-04-02 DIAGNOSIS — L720 Epidermal cyst: Secondary | ICD-10-CM | POA: Diagnosis not present

## 2023-04-02 DIAGNOSIS — D235 Other benign neoplasm of skin of trunk: Secondary | ICD-10-CM | POA: Diagnosis not present

## 2023-04-30 ENCOUNTER — Ambulatory Visit (HOSPITAL_BASED_OUTPATIENT_CLINIC_OR_DEPARTMENT_OTHER)
Admission: EM | Admit: 2023-04-30 | Discharge: 2023-04-30 | Disposition: A | Discharge status: Home / Self Care | Payer: BC Managed Care – PPO | Attending: Internal Medicine | Admitting: Internal Medicine

## 2023-04-30 ENCOUNTER — Other Ambulatory Visit: Payer: Self-pay

## 2023-04-30 ENCOUNTER — Encounter (HOSPITAL_BASED_OUTPATIENT_CLINIC_OR_DEPARTMENT_OTHER): Payer: Self-pay | Admitting: Emergency Medicine

## 2023-04-30 DIAGNOSIS — Z8679 Personal history of other diseases of the circulatory system: Secondary | ICD-10-CM | POA: Diagnosis not present

## 2023-04-30 DIAGNOSIS — B349 Viral infection, unspecified: Secondary | ICD-10-CM | POA: Diagnosis not present

## 2023-04-30 DIAGNOSIS — J040 Acute laryngitis: Secondary | ICD-10-CM

## 2023-04-30 MED ORDER — PROMETHAZINE-DM 6.25-15 MG/5ML PO SYRP: 5.0000 mL | ORAL_SOLUTION | Freq: Four times a day (QID) | ORAL | 0 refills | Status: AC | PRN

## 2023-04-30 NOTE — ED Provider Notes (Signed)
[facility]    CSN: [id number] Arrival date & time: 04/30/23  1603      History   Chief Complaint Chief Complaint  Patient presents with   Nasal Congestion   Cough    HPI Bruce Mckee is a 37 y.o. male.   The history is provided by the patient.  Cough Associated symptoms: ear pain, rhinorrhea and shortness of breath   Associated symptoms: no chest pain, no fever, no myalgias, no sore throat and no wheezing   Not feeling well for 3 days symptoms include cough, nasal congestion, fatigue, sinus pain and pressure in ears.  Admits hoarse voice.  He has been taking Mucinex and Sudafed without relief.  He works as a Building services engineer, while walking in [employer] today he felt a little short of breath. Admits feeling sweaty now.  Denies documented fever, chills, body aches, chest pain, palpitations, dizziness or lightheadedness, sore throat, swollen glands, headache. Has 2 small children who have not been ill. Past medical history includes pericarditis, atypical chest pain, was seen by cardiology earlier this year, normal exercise tolerance test June 2024.  Past Medical History:  Diagnosis Date   Atypical chest pain 08/05/2019   Dyspnea on exertion 11/13/2022   Pericarditis 08/05/2019    Patient Active Problem List   Diagnosis Date Noted   Dyspnea on exertion 11/13/2022   Atypical chest pain 08/05/2019   Pericarditis 08/05/2019    Past Surgical History:  Procedure Laterality Date   HAND SURGERY Left        Home Medications    Prior to Admission medications   Not on File    Family History Family History  Problem Relation Age of Onset   Heart disease Maternal Grandfather    Heart failure Maternal Grandfather     Social History Social History   Tobacco Use   Smoking status: Never   Smokeless tobacco: Never     Allergies   Amoxicillin and Doxycycline   Review of Systems Review of Systems  Constitutional:  Positive for fatigue. Negative for appetite  change and fever.  HENT:  Positive for congestion, ear pain, rhinorrhea, sinus pressure, sinus pain and voice change. Negative for sore throat and trouble swallowing.   Respiratory:  Positive for cough and shortness of breath. Negative for wheezing.   Cardiovascular:  Negative for chest pain.  Gastrointestinal:  Negative for abdominal distention, nausea and vomiting.  Musculoskeletal:  Negative for myalgias.     Physical Exam Triage Vital Signs ED Triage Vitals [04/30/23 1611]  Encounter Vitals Group     BP (!) 162/100     Systolic BP Percentile      Diastolic BP Percentile      Pulse Rate 87     Resp 17     Temp 97.9 F (36.6 C)     Temp Source Oral     SpO2 98 %     Weight      Height      Head Circumference      Peak Flow      Pain Score 3     Pain Loc      Pain Education      Exclude from Growth Chart    No data found.  Updated Vital Signs BP (!) 162/100 (BP Location: Right Arm)   Pulse 87   Temp 97.9 F (36.6 C) (Oral)   Resp 17   SpO2 98%   Visual Acuity Right Eye Distance:   Left Eye Distance:  Bilateral Distance:    Right Eye Near:   Left Eye Near:    Bilateral Near:     Physical Exam Vitals and nursing note reviewed.  Constitutional:      Appearance: He is diaphoretic. He is not ill-appearing.  HENT:     Head: Normocephalic and atraumatic.     Right Ear: Tympanic membrane and ear canal normal.     Left Ear: Tympanic membrane and ear canal normal.     Nose: No rhinorrhea.     Mouth/Throat:     Mouth: Mucous membranes are moist.     Pharynx: Oropharynx is clear. Uvula midline. No oropharyngeal exudate or posterior oropharyngeal erythema.     Tonsils: No tonsillar exudate or tonsillar abscesses.     Comments: Hoarse voice Eyes:     Conjunctiva/sclera: Conjunctivae normal.  Cardiovascular:     Rate and Rhythm: Normal rate.     Heart sounds: Normal heart sounds.  Pulmonary:     Effort: Pulmonary effort is normal.     Breath sounds: Normal  breath sounds.  Musculoskeletal:     Cervical back: Neck supple.  Lymphadenopathy:     Cervical: No cervical adenopathy.  Skin:    General: Skin is warm.  Neurological:     Mental Status: He is alert.      UC Treatments / Results  Labs (all labs ordered are listed, but only abnormal results are displayed) Labs Reviewed - No data to display  EKG   Radiology No results found.  Procedures Procedures (including critical care time)  Medications Ordered in UC Medications - No data to display  Initial Impression / Assessment and Plan / UC Course  I have reviewed the triage vital signs and the nursing notes.  Pertinent labs & imaging results that were available during my care of the patient were reviewed by me and considered in my medical decision making (see chart for details).  EKG independently viewed by me normal sinus rhythm at a rate of 83 left axis no acute ischemic changes  37 year old male With fatigue cough congestion for 3 days taking over-the-counter medicines without relief.  Has felt but no documented fever.  Noted some shortness of breath today, without any chest pain.  He has hoarse voice, mildly hypertensive at 162/100, afebrile, regular rate and rhythm without murmur, no acute changes on his EKG, lungs are clear to auscultation, no evidence of sinus infection or pneumonia at this time.  Likely viral syndrome with laryngitis, home management and follow-up reviewed with patient, go to ED for development of chest pain, worsening shortness of breath or concerns.  See PCP next week to recheck blood pressure  Final diagnoses:  None   Discharge Instructions   None    ED Prescriptions   None    PDMP not reviewed this encounter.   Meliton Rattan, [place] 04/30/23 [phone]

## 2023-04-30 NOTE — Discharge Instructions (Addendum)
Stop the pseudoephedrine. Rest, increase fluids.  Monitor your temperature. See your doctor if your symptoms fail to improve, go to the emergency department if you develop high fever, shaking chills, chest pain, worsening shortness of breath, concerns.

## 2023-04-30 NOTE — ED Triage Notes (Signed)
Pt c/o sinus congestion and sinus pain since Monday. Taking Mucinex and Sudafed without relief.

## 2023-12-06 ENCOUNTER — Encounter (HOSPITAL_BASED_OUTPATIENT_CLINIC_OR_DEPARTMENT_OTHER): Payer: Self-pay | Admitting: Emergency Medicine

## 2023-12-06 ENCOUNTER — Ambulatory Visit (HOSPITAL_BASED_OUTPATIENT_CLINIC_OR_DEPARTMENT_OTHER)
Admission: EM | Admit: 2023-12-06 | Discharge: 2023-12-06 | Disposition: A | Discharge status: Home / Self Care | Attending: Family Medicine | Admitting: Family Medicine

## 2023-12-06 DIAGNOSIS — J0100 Acute maxillary sinusitis, unspecified: Secondary | ICD-10-CM

## 2023-12-06 DIAGNOSIS — J3489 Other specified disorders of nose and nasal sinuses: Secondary | ICD-10-CM | POA: Diagnosis not present

## 2023-12-06 MED ORDER — TRIAMCINOLONE ACETONIDE 40 MG/ML IJ SUSP
40.0000 mg | Freq: Once | INTRAMUSCULAR | Status: AC
  Administered 2023-12-06: 40 mg via INTRAMUSCULAR

## 2023-12-06 MED ORDER — CEFDINIR 300 MG PO CAPS: 300.0000 mg | ORAL_CAPSULE | Freq: Two times a day (BID) | ORAL | 0 refills | Status: AC

## 2023-12-06 NOTE — Discharge Instructions (Signed)
 Sinusitis with sinus pressure and pain: Cefdinir  300 mg twice daily for 7 days.  Encouraged sinus rinses twice daily with salt water bottle or Nettie pot.  Kenalog  40 mg injection now (this is a steroid injection).  Get plenty of fluids and rest.  Follow-up if symptoms do not improve, worsen or new symptoms occur.

## 2023-12-06 NOTE — ED Provider Notes (Signed)
 [facility]    CSN: [id number] Arrival date & time: 12/06/23  9178      History   Chief Complaint Chief Complaint  Patient presents with   Cough   Nasal Congestion   Sore Throat    HPI Bruce Mckee is a 38 y.o. male.   Patient reports runny nose, cough congestion since 11/29/2023.  Since that time he has developed facial pain and pressure.  He took a home COVID test that was negative on 12/02/2023.  That day he had chills and thought he had a fever.  He has now got greenish-brown nasal discharge and more facial pressure and pain.   Cough Associated symptoms: chills, fever and rhinorrhea   Associated symptoms: no chest pain, no ear pain, no rash and no sore throat   Sore Throat Pertinent negatives include no chest pain and no abdominal pain.    Past Medical History:  Diagnosis Date   Atypical chest pain 08/05/2019   Dyspnea on exertion 11/13/2022   Pericarditis 08/05/2019    Patient Active Problem List   Diagnosis Date Noted   Dyspnea on exertion 11/13/2022   Atypical chest pain 08/05/2019   Pericarditis 08/05/2019    Past Surgical History:  Procedure Laterality Date   HAND SURGERY Left        Home Medications    Prior to Admission medications   Medication Sig Start Date End Date Taking? Authorizing Provider  cefdinir  (OMNICEF ) 300 MG capsule Take 1 capsule (300 mg total) by mouth 2 (two) times daily for 7 days. 12/06/23 12/13/23 Yes Ival Domino, FNP    Family History Family History  Problem Relation Age of Onset   Heart disease Maternal Grandfather    Heart failure Maternal Grandfather     Social History Social History   Tobacco Use   Smoking status: Never   Smokeless tobacco: Never     Allergies   Amoxicillin and Doxycycline   Review of Systems Review of Systems  Constitutional:  Positive for chills and fever.  HENT:  Positive for congestion, postnasal drip, rhinorrhea, sinus pressure and sinus pain. Negative for ear pain and  sore throat.   Eyes:  Negative for pain and visual disturbance.  Respiratory:  Positive for cough.   Cardiovascular:  Negative for chest pain and palpitations.  Gastrointestinal:  Negative for abdominal pain, constipation, diarrhea, nausea and vomiting.  Genitourinary:  Negative for dysuria and hematuria.  Musculoskeletal:  Negative for arthralgias and back pain.  Skin:  Negative for color change and rash.  Neurological:  Negative for seizures and syncope.  All other systems reviewed and are negative.    Physical Exam Triage Vital Signs ED Triage Vitals [12/06/23 0838]  Encounter Vitals Group     BP 125/85     Girls Systolic BP Percentile      Girls Diastolic BP Percentile      Boys Systolic BP Percentile      Boys Diastolic BP Percentile      Pulse Rate 66     Resp 18     Temp 98.2 F (36.8 C)     Temp Source Oral     SpO2 97 %     Weight      Height      Head Circumference      Peak Flow      Pain Score 0     Pain Loc      Pain Education      Exclude from Growth Chart  No data found.  Updated Vital Signs BP 125/85 (BP Location: Right Arm)   Pulse 66   Temp 98.2 F (36.8 C) (Oral)   Resp 18   SpO2 97%   Visual Acuity Right Eye Distance:   Left Eye Distance:   Bilateral Distance:    Right Eye Near:   Left Eye Near:    Bilateral Near:     Physical Exam Vitals and nursing note reviewed.  Constitutional:      General: He is not in acute distress.    Appearance: He is well-developed. He is not ill-appearing or toxic-appearing.  HENT:     Head: Normocephalic and atraumatic.     Right Ear: Hearing, tympanic membrane, ear canal and external ear normal.     Left Ear: Hearing, tympanic membrane, ear canal and external ear normal.     Nose: Mucosal edema, congestion and rhinorrhea present. Rhinorrhea is purulent.     Right Sinus: Maxillary sinus tenderness present. No frontal sinus tenderness.     Left Sinus: Maxillary sinus tenderness present. No frontal  sinus tenderness.     Mouth/Throat:     Lips: Pink.     Mouth: Mucous membranes are moist.     Pharynx: Uvula midline. No oropharyngeal exudate or posterior oropharyngeal erythema.     Tonsils: No tonsillar exudate.  Eyes:     Conjunctiva/sclera: Conjunctivae normal.     Pupils: Pupils are equal, round, and reactive to light.  Cardiovascular:     Rate and Rhythm: Normal rate and regular rhythm.     Heart sounds: S1 normal and S2 normal. No murmur heard. Pulmonary:     Effort: Pulmonary effort is normal. No respiratory distress.     Breath sounds: Normal breath sounds. No decreased breath sounds, wheezing, rhonchi or rales.  Abdominal:     General: Bowel sounds are normal.     Palpations: Abdomen is soft.     Tenderness: There is no abdominal tenderness.  Musculoskeletal:        General: No swelling.     Cervical back: Neck supple.  Lymphadenopathy:     Head:     Right side of head: Tonsillar adenopathy present. No submental, submandibular, preauricular or posterior auricular adenopathy.     Left side of head: Tonsillar adenopathy present. No submental, submandibular, preauricular or posterior auricular adenopathy.     Cervical: Cervical adenopathy present.     Right cervical: Superficial cervical adenopathy present.     Left cervical: Superficial cervical adenopathy present.  Skin:    General: Skin is warm and dry.     Capillary Refill: Capillary refill takes less than 2 seconds.     Findings: No rash.  Neurological:     Mental Status: He is alert and oriented to person, place, and time.  Psychiatric:        Mood and Affect: Mood normal.      UC Treatments / Results  Labs (all labs ordered are listed, but only abnormal results are displayed) Labs Reviewed - No data to display  EKG   Radiology No results found.  Procedures Procedures (including critical care time)  Medications Ordered in UC Medications  triamcinolone  acetonide (KENALOG -40) injection 40 mg (40  mg Intramuscular Given 12/06/23 0935)    Initial Impression / Assessment and Plan / UC Course  I have reviewed the triage vital signs and the nursing notes.  Pertinent labs & imaging results that were available during my care of the patient were reviewed by me  and considered in my medical decision making (see chart for details).  Plan of Care: Sinusitis with sinus pressure and pain: Kenalog  40 mg injection now.  Cefdinir  300 mg twice daily for 7 days.  Get plenty of fluids and rest.  Encouraged sinus rinses twice daily.  Follow-up if symptoms do not improve, worsen or new symptoms occur.  I reviewed the plan of care with the patient and/or the patient's guardian.  The patient and/or guardian had time to ask questions and acknowledged that the questions were answered.  I provided instruction on symptoms or reasons to return here or to go to an ER, if symptoms/condition did not improve, worsened or if new symptoms occurred.  Final Clinical Impressions(s) / UC Diagnoses   Final diagnoses:  Acute non-recurrent maxillary sinusitis  Sinus pain     Discharge Instructions      Sinusitis with sinus pressure and pain: Cefdinir  300 mg twice daily for 7 days.  Encouraged sinus rinses twice daily with salt water bottle or Nettie pot.  Kenalog  40 mg injection now (this is a steroid injection).  Get plenty of fluids and rest.  Follow-up if symptoms do not improve, worsen or new symptoms occur.     ED Prescriptions     Medication Sig Dispense Auth. Provider   cefdinir  (OMNICEF ) 300 MG capsule Take 1 capsule (300 mg total) by mouth 2 (two) times daily for 7 days. 14 capsule Ival Domino, FNP      PDMP not reviewed this encounter.   Ival Domino, FNP 12/06/23 [phone]

## 2023-12-06 NOTE — ED Triage Notes (Signed)
 Pt reports started last Sunday he c/o sore throat, chest congestion, lots of sinus pressure. He did take at home Covid test and it was negative. Pt reports his mucous is green in color.

## 2024-01-15 ENCOUNTER — Encounter (HOSPITAL_BASED_OUTPATIENT_CLINIC_OR_DEPARTMENT_OTHER): Payer: Self-pay | Admitting: Family Medicine

## 2024-01-15 ENCOUNTER — Ambulatory Visit (HOSPITAL_BASED_OUTPATIENT_CLINIC_OR_DEPARTMENT_OTHER): Admitting: Family Medicine

## 2024-01-15 VITALS — BP 121/78 | HR 68 | Temp 98.0°F | Resp 18 | Ht 73.0 in | Wt 249.0 lb

## 2024-01-15 DIAGNOSIS — F419 Anxiety disorder, unspecified: Secondary | ICD-10-CM | POA: Diagnosis not present

## 2024-01-15 MED ORDER — ALPRAZOLAM 0.25 MG PO TABS: 0.2500 mg | ORAL_TABLET | Freq: Two times a day (BID) | ORAL | 0 refills | Status: AC | PRN

## 2024-01-15 MED ORDER — VENLAFAXINE HCL ER 37.5 MG PO CP24: 37.5000 mg | ORAL_CAPSULE | Freq: Every day | ORAL | 0 refills | Status: DC

## 2024-01-15 NOTE — Assessment & Plan Note (Signed)
 He declines counseling for now.  Trial of Venlafaxine  with meals.  He agrees to update me in 7-10 days, so we can increase his dosage as long as he is tolerating it.  Thorough discussion about both meds and possible side effects.  He appropriately also wishes to schedule a CPE in the relatively near future.

## 2024-01-15 NOTE — Progress Notes (Signed)
 Established Patient Office Visit  Subjective   Patient ID: Bruce Mckee, male    DOB: May 15, 1986  Age: 38 y.o. MRN: [id number]  Chief Complaint  Patient presents with   New Patient (Initial Visit)    Stress and anxiety    F/u as above.  Known to me in the past, but I haven't seen him in some time.  His anxiety has been a problem for some time.  He is ready to take steps to improve.  Has a good bit of work stress.  Has done therapy in years past, but has limited interest now.  He reminds me that he was quite intolerant to the Lexapro in years past.  He isn't terribly concerned about mood swings.    Past Medical History:  Diagnosis Date   Allergies    Anxiety     Outpatient Encounter Medications as of 01/15/2024  Medication Sig   ALPRAZolam  (XANAX ) 0.25 MG tablet Take 1 tablet (0.25 mg total) by mouth 2 (two) times daily as needed (Sedation precautions and avoid alcohol).   venlafaxine  XR (EFFEXOR  XR) 37.5 MG 24 hr capsule Take 1 capsule (37.5 mg total) by mouth daily with breakfast.   No facility-administered encounter medications on file as of 01/15/2024.    Social History   Tobacco Use   Smoking status: Never   Smokeless tobacco: Never  Substance Use Topics   Alcohol use: Not Currently   Drug use: Not Currently      Review of Systems  Constitutional:  Positive for malaise/fatigue. Negative for weight loss.  Cardiovascular:  Negative for chest pain and palpitations.  Neurological:  Negative for dizziness.  Psychiatric/Behavioral:  Positive for depression. Negative for hallucinations, memory loss, substance abuse and suicidal ideas. The patient is nervous/anxious. The patient does not have insomnia.       Objective:     BP 121/78 (BP Location: Right Arm, Patient Position: Sitting)   Pulse 68   Temp 98 F (36.7 C)   Resp 18   Ht 6' 1 (1.854 m)   Wt 249 lb (112.9 kg)   SpO2 98%   BMI 32.85 kg/m    Physical Exam Constitutional:      General: He is not  in acute distress.    Appearance: Normal appearance.  HENT:     Head: Normocephalic.  Neck:     Vascular: No carotid bruit.  Cardiovascular:     Rate and Rhythm: Normal rate and regular rhythm.     Pulses: Normal pulses.     Heart sounds: Normal heart sounds.  Pulmonary:     Effort: Pulmonary effort is normal.     Breath sounds: Normal breath sounds.  Abdominal:     General: Bowel sounds are normal.     Palpations: Abdomen is soft.  Musculoskeletal:     Cervical back: Neck supple. No tenderness.     Right lower leg: No edema.     Left lower leg: No edema.  Neurological:     Mental Status: He is alert.  Psychiatric:     Comments: Mildly anxious      No results found for any visits on 01/15/24.    The ASCVD Risk score (Arnett DK, et al., 2019) failed to calculate for the following reasons:   The 2019 ASCVD risk score is only valid for ages 1 to 11    Assessment & Plan:  Anxiety Assessment & Plan: He declines counseling for now.  Trial of Venlafaxine  with meals.  He agrees to update me in 7-10 days, so we can increase his dosage as long as he is tolerating it.  Thorough discussion about both meds and possible side effects.  He appropriately also wishes to schedule a CPE in the relatively near future.  Orders: -     Venlafaxine  HCl ER; Take 1 capsule (37.5 mg total) by mouth daily with breakfast.  Dispense: 30 capsule; Refill: 0 -     ALPRAZolam ; Take 1 tablet (0.25 mg total) by mouth 2 (two) times daily as needed (Sedation precautions and avoid alcohol).  Dispense: 20 tablet; Refill: 0  I personally spent a total of 25 minutes in the care of the patient today including performing a medically appropriate exam/evaluation, counseling and educating, placing orders, and documenting clinical information in the EHR.   Return in about 3 months (around 04/16/2024).    REDDING PONCE NORLEEN FALCON., MD

## 2024-01-20 ENCOUNTER — Telehealth (HOSPITAL_BASED_OUTPATIENT_CLINIC_OR_DEPARTMENT_OTHER): Payer: Self-pay

## 2024-01-20 NOTE — Telephone Encounter (Signed)
 Copied from CRM [id number]. Topic: Clinical - Medication Question >> Jan 20, 2024  8:16 AM Amy B wrote: Reason for CRM: Patient states he currently takes VENLAFAXINE  during the day but is experiencing side effects.  He would like to know if he can take it at night instead so he can avoid the side effects while working.  He requests a call back but may be out of cell phone range and would like a voicemail if he cannot answer.

## 2024-01-21 NOTE — Telephone Encounter (Signed)
 LMTCB

## 2024-01-22 ENCOUNTER — Encounter (HOSPITAL_BASED_OUTPATIENT_CLINIC_OR_DEPARTMENT_OTHER): Payer: Self-pay

## 2024-01-28 ENCOUNTER — Encounter (HOSPITAL_BASED_OUTPATIENT_CLINIC_OR_DEPARTMENT_OTHER): Payer: Self-pay | Admitting: Family Medicine

## 2024-01-29 ENCOUNTER — Ambulatory Visit (HOSPITAL_BASED_OUTPATIENT_CLINIC_OR_DEPARTMENT_OTHER)
Admission: RE | Admit: 2024-01-29 | Discharge: 2024-01-29 | Disposition: A | Discharge status: Home / Self Care | Source: Ambulatory Visit | Attending: Family Medicine | Admitting: Family Medicine

## 2024-01-29 ENCOUNTER — Other Ambulatory Visit (HOSPITAL_BASED_OUTPATIENT_CLINIC_OR_DEPARTMENT_OTHER): Payer: Self-pay

## 2024-01-29 ENCOUNTER — Encounter (HOSPITAL_BASED_OUTPATIENT_CLINIC_OR_DEPARTMENT_OTHER): Payer: Self-pay

## 2024-01-29 VITALS — BP 121/86 | HR 70 | Temp 98.2°F | Resp 18

## 2024-01-29 DIAGNOSIS — R051 Acute cough: Secondary | ICD-10-CM

## 2024-01-29 DIAGNOSIS — J208 Acute bronchitis due to other specified organisms: Secondary | ICD-10-CM | POA: Diagnosis not present

## 2024-01-29 LAB — POC SOFIA SARS ANTIGEN FIA: SARS Coronavirus 2 Ag: NEGATIVE

## 2024-01-29 MED ORDER — AIRSUPRA 90-80 MCG/ACT IN AERO
2.0000 | INHALATION_SPRAY | RESPIRATORY_TRACT | 0 refills | Status: AC | PRN
  Filled 2024-01-29: qty 10.7, 10d supply, fill #0

## 2024-01-29 MED ORDER — TRIAMCINOLONE ACETONIDE 40 MG/ML IJ SUSP
80.0000 mg | Freq: Once | INTRAMUSCULAR | Status: AC
  Administered 2024-01-29: 80 mg via INTRAMUSCULAR

## 2024-01-29 MED ORDER — PROMETHAZINE-DM 6.25-15 MG/5ML PO SYRP
5.0000 mL | ORAL_SOLUTION | Freq: Four times a day (QID) | ORAL | 0 refills | Status: AC | PRN
  Filled 2024-01-29: qty 118, 6d supply, fill #0

## 2024-01-29 NOTE — Discharge Instructions (Signed)
 Acute viral bronchitis versus possible asthma exacerbation: COVID-19 test was negative.  Airsupra , 2 puffs every 4 hours if needed for wheezing.  Rinse mouth after use.  Promethazine  DM, 5 mL, every 6 hours as needed for cough.  Get plenty of fluids and rest.  Kenalog  80 mg injection now.  Work excuse provided, if needed.  Follow-up if symptoms do not improve, worsen or new symptoms occur.

## 2024-01-29 NOTE — ED Triage Notes (Signed)
 Pt c/o coughing, some nasal congestion and ear pressure started on Monday. Pt reports he was walking up the [place] this morning at work and he felt short of breath.

## 2024-01-29 NOTE — ED Provider Notes (Addendum)
 [facility]    CSN: [id number] Arrival date & time: 01/29/24  1006      History   Chief Complaint Chief Complaint  Patient presents with   Cough    HPI Bruce Mckee is a 38 y.o. male.   38 year old male whose had cough, nasal congestion and ear pressure that started on 01/25/2024.  He has a history of reactive airway disease or asthma type symptoms when he gets bad colds.  He does not have issues normally nor with exercise but if he gets a bad cold he oftentimes gets kind of asthma-like wheezing and shortness of breath.  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  He denies fever, body aches, nausea, vomiting, constipation, diarrhea.   Cough Associated symptoms: rhinorrhea, shortness of breath and wheezing   Associated symptoms: no chest pain, no chills, no ear pain, no fever, no rash and no sore throat     Past Medical History:  Diagnosis Date   Allergies    Anxiety     Patient Active Problem List   Diagnosis Date Noted   Anxiety 01/15/2024   Dyspnea on exertion 11/13/2022   Atypical chest pain 08/05/2019   Pericarditis 08/05/2019    Past Surgical History:  Procedure Laterality Date   HAND SURGERY Left        Home Medications    Prior to Admission medications   Medication Sig Start Date End Date Taking? Authorizing Provider  Albuterol -Budesonide  (AIRSUPRA ) 90-80 MCG/ACT AERO Inhale 2 puffs into the lungs every 4 (four) hours as needed (wheezing.  Rinse mouth after use). 01/29/24  Yes Ival Domino, FNP  promethazine -dextromethorphan (PROMETHAZINE -DM) 6.25-15 MG/5ML syrup Take 5 mLs by mouth 4 (four) times daily as needed for  cough. Do not use and drive - May make drowsy. 01/29/24  Yes Ival Domino, FNP  venlafaxine  XR (EFFEXOR  XR) 37.5 MG 24 hr capsule Take 1 capsule (37.5 mg total) by mouth daily with breakfast. 01/15/24  Yes Dottie Norleen PHEBE PONCE, MD  ALPRAZolam  (XANAX ) 0.25 MG tablet Take 1 tablet (0.25 mg total) by mouth 2 (two) times daily as needed (Sedation precautions and avoid alcohol). 01/15/24   Dottie Norleen PHEBE PONCE, MD    Family History Family History  Problem Relation Age of Onset   Lung cancer Mother    Bladder Cancer Father    Heart disease Maternal Grandfather    Heart failure Maternal Grandfather     Social History Social History   Tobacco Use   Smoking status: Never   Smokeless tobacco: Never  Substance Use Topics   Alcohol use: Not Currently   Drug use: Not Currently     Allergies   Amoxicillin, Doxycycline, and Lexapro [escitalopram]   Review of Systems Review of Systems  Constitutional:  Negative for chills and fever.  HENT:  Positive for congestion, postnasal drip and rhinorrhea. Negative for ear pain and sore throat.   Eyes:  Negative for pain and visual disturbance.  Respiratory:  Positive for cough, shortness of breath and wheezing.   Cardiovascular:  Negative for chest pain and  palpitations.  Gastrointestinal:  Negative for abdominal pain, constipation, diarrhea, nausea and vomiting.  Genitourinary:  Negative for dysuria and hematuria.  Musculoskeletal:  Negative for arthralgias and back pain.  Skin:  Negative for color change and rash.  Neurological:  Negative for seizures and syncope.  All other systems reviewed and are negative.    Physical Exam Triage Vital Signs ED Triage Vitals  Encounter Vitals Group     BP 01/29/24 1014 121/86     Girls Systolic BP Percentile --      Girls Diastolic BP Percentile --      Boys Systolic BP Percentile --      Boys Diastolic BP Percentile --      Pulse Rate 01/29/24 1014 70     Resp 01/29/24 1014 18     Temp 01/29/24 1014  98.2 F (36.8 C)     Temp Source 01/29/24 1014 Oral     SpO2 01/29/24 1014 97 %     Weight --      Height --      Head Circumference --      Peak Flow --      Pain Score 01/29/24 1013 0     Pain Loc --      Pain Education --      Exclude from Growth Chart --    No data found.  Updated Vital Signs BP 121/86 (BP Location: Right Arm)   Pulse 70   Temp 98.2 F (36.8 C) (Oral)   Resp 18   SpO2 97%   Visual Acuity Right Eye Distance:   Left Eye Distance:   Bilateral Distance:    Right Eye Near:   Left Eye Near:    Bilateral Near:     Physical Exam Vitals and nursing note reviewed.  Constitutional:      General: He is not in acute distress.    Appearance: He is well-developed. He is not ill-appearing or toxic-appearing.  HENT:     Head: Normocephalic and atraumatic.     Right Ear: Hearing, tympanic membrane, ear canal and external ear normal.     Left Ear: Hearing, tympanic membrane, ear canal and external ear normal.     Nose: No congestion or rhinorrhea.     Right Sinus: No maxillary sinus tenderness or frontal sinus tenderness.     Left Sinus: No maxillary sinus tenderness or frontal sinus tenderness.     Mouth/Throat:     Lips: Pink.     Mouth: Mucous membranes are moist.     Pharynx: Uvula midline. No oropharyngeal exudate or posterior oropharyngeal erythema.     Tonsils: No tonsillar exudate.  Eyes:     Conjunctiva/sclera: Conjunctivae normal.     Pupils: Pupils are equal, round, and reactive to light.  Cardiovascular:     Rate and Rhythm: Normal rate and regular rhythm.     Heart sounds: S1 normal and S2 normal. No murmur heard. Pulmonary:     Effort: Pulmonary effort is normal. No respiratory distress.     Breath sounds: Examination of the right-upper field reveals wheezing. Examination of the left-upper field reveals wheezing. Examination of the right-middle field reveals wheezing. Examination of the left-middle field reveals wheezing. Examination of the  right-lower field reveals wheezing. Examination of the left-lower field reveals wheezing. Wheezing (Intermittent inspiratory wheezes.) present. No decreased breath sounds, rhonchi or rales.  Abdominal:     General: Bowel sounds are normal.     Palpations: Abdomen is soft.  Tenderness: There is no abdominal tenderness.  Musculoskeletal:        General: No swelling.     Cervical back: Neck supple.  Lymphadenopathy:     Head:     Right side of head: No submental, submandibular, tonsillar, preauricular or posterior auricular adenopathy.     Left side of head: No submental, submandibular, tonsillar, preauricular or posterior auricular adenopathy.     Cervical: No cervical adenopathy.     Right cervical: No superficial cervical adenopathy.    Left cervical: No superficial cervical adenopathy.  Skin:    General: Skin is warm and dry.     Capillary Refill: Capillary refill takes less than 2 seconds.     Findings: No rash.  Neurological:     Mental Status: He is alert and oriented to person, place, and time.  Psychiatric:        Mood and Affect: Mood normal.      UC Treatments / Results  Labs (all labs ordered are listed, but only abnormal results are displayed) Labs Reviewed  POC SOFIA SARS ANTIGEN FIA - Normal    EKG   Radiology No results found.  Procedures Procedures (including critical care time)  Medications Ordered in UC Medications  triamcinolone  acetonide (KENALOG -40) injection 80 mg (80 mg Intramuscular Given 01/29/24 1144)    Initial Impression / Assessment and Plan / UC Course  I have reviewed the triage vital signs and the nursing notes.  Pertinent labs & imaging results that were available during my care of the patient were reviewed by me and considered in my medical decision making (see chart for details).  Plan of Care: Viral bronchitis with cough versus asthma exacerbation: Patient has not ever been diagnosed formally with asthma but tends to get asthma  symptoms with any bad cold or congestion.  He rarely ever gets over his symptoms without some kind of asthma type treatment.  COVID test was negative.  Kenalog  80 mg injection now.  Promethazine  DM, 5 mL, every 6 hours as needed for cough or congestion.  Airsupra  inhaler, 2 puffs, every 4 hours as needed for wheezing.  Get plenty of fluids and rest.  Work excuse provided.  Follow-up here or with primary care if symptoms do not improve, worsen or new symptoms occur.  May need to see a pulmonologist or allergist if symptoms persist.  I reviewed the plan of care with the patient and/or the patient's guardian.  The patient and/or guardian had time to ask questions and acknowledged that the questions were answered.  I provided instruction on symptoms or reasons to return here or to go to an ER, if symptoms/condition did not improve, worsened or if new symptoms occurred.  Final Clinical Impressions(s) / UC Diagnoses   Final diagnoses:  Acute cough  Acute viral bronchitis     Discharge Instructions      Acute viral bronchitis versus possible asthma exacerbation: COVID-19 test was negative.  Airsupra , 2 puffs every 4 hours if needed for wheezing.  Rinse mouth after use.  Promethazine  DM, 5 mL, every 6 hours as needed for cough.  Get plenty of fluids and rest.  Kenalog  80 mg injection now.  Work excuse provided, if needed.  Follow-up if symptoms do not improve, worsen or new symptoms occur.     ED Prescriptions     Medication Sig Dispense Auth. Provider   Albuterol -Budesonide  (AIRSUPRA ) 90-80 MCG/ACT AERO Inhale 2 puffs into the lungs every 4 (four) hours as needed (wheezing.  Rinse mouth  after use). 10.7 g Ival Domino, FNP   promethazine -dextromethorphan (PROMETHAZINE -DM) 6.25-15 MG/5ML syrup Take 5 mLs by mouth 4 (four) times daily as needed for cough. Do not use and drive - May make drowsy. 118 mL Ival Domino, FNP      PDMP not reviewed this encounter.   Ival Domino, FNP 01/29/24  1143    Ival Domino, FNP 01/29/24 1149

## 2024-02-01 ENCOUNTER — Other Ambulatory Visit (HOSPITAL_BASED_OUTPATIENT_CLINIC_OR_DEPARTMENT_OTHER): Payer: Self-pay | Admitting: Family Medicine

## 2024-02-01 DIAGNOSIS — F419 Anxiety disorder, unspecified: Secondary | ICD-10-CM

## 2024-02-01 MED ORDER — VENLAFAXINE HCL ER 75 MG PO CP24: 75.0000 mg | ORAL_CAPSULE | Freq: Every day | ORAL | 0 refills | Status: DC

## 2024-02-12 ENCOUNTER — Ambulatory Visit (INDEPENDENT_AMBULATORY_CARE_PROVIDER_SITE_OTHER): Admitting: Family Medicine

## 2024-02-12 ENCOUNTER — Other Ambulatory Visit (HOSPITAL_BASED_OUTPATIENT_CLINIC_OR_DEPARTMENT_OTHER): Payer: Self-pay

## 2024-02-12 ENCOUNTER — Encounter (HOSPITAL_BASED_OUTPATIENT_CLINIC_OR_DEPARTMENT_OTHER): Payer: Self-pay | Admitting: Family Medicine

## 2024-02-12 VITALS — BP 140/92 | HR 69 | Temp 97.6°F | Resp 16 | Wt 249.9 lb

## 2024-02-12 DIAGNOSIS — Z0000 Encounter for general adult medical examination without abnormal findings: Secondary | ICD-10-CM | POA: Insufficient documentation

## 2024-02-12 DIAGNOSIS — Z23 Encounter for immunization: Secondary | ICD-10-CM

## 2024-02-12 DIAGNOSIS — Z8709 Personal history of other diseases of the respiratory system: Secondary | ICD-10-CM | POA: Insufficient documentation

## 2024-02-12 DIAGNOSIS — F419 Anxiety disorder, unspecified: Secondary | ICD-10-CM | POA: Diagnosis not present

## 2024-02-12 DIAGNOSIS — Z8669 Personal history of other diseases of the nervous system and sense organs: Secondary | ICD-10-CM | POA: Insufficient documentation

## 2024-02-12 MED ORDER — VENLAFAXINE HCL ER 150 MG PO CP24
150.0000 mg | ORAL_CAPSULE | Freq: Every day | ORAL | 2 refills | Status: AC
Start: 2024-02-12 — End: ?
  Filled 2024-02-12: qty 30, 30d supply, fill #0
  Filled 2024-03-08: qty 30, 30d supply, fill #1
  Filled 2024-04-11: qty 30, 30d supply, fill #2

## 2024-02-12 NOTE — Assessment & Plan Note (Addendum)
 Commended on healthy habits.  Advised at least annual dermatology visits, especially with the nevi on his back.  Encouraged a little weight loss.  Venlafaxine  increased.  Await labs and f/u with him soon.  Flu shot advised in about 2 weeks.  Will check on his BP soon also.

## 2024-02-12 NOTE — Progress Notes (Signed)
 Established Patient Office Visit  Subjective   Patient ID: Bruce Mckee, male    DOB: October 27, 1985  Age: 38 y.o. MRN: [id number]  No chief complaint on file.   F/u as above.  Please see last note for details.  I'm pleased to hear he's tolerating the Venlafaxine  well and is ready for a dosage increase.  He does exercise some, but admits he could do better.  Rare need for his rescue inhaler.    Past Medical History:  Diagnosis Date   Allergies    Anxiety    Asthma    Mild   Overweight     Outpatient Encounter Medications as of 02/12/2024  Medication Sig   Albuterol -Budesonide  (AIRSUPRA ) 90-80 MCG/ACT AERO Inhale 2 puffs into the lungs every 4 (four) hours as needed (wheezing.  Rinse mouth after use).   ALPRAZolam  (XANAX ) 0.25 MG tablet Take 1 tablet (0.25 mg total) by mouth 2 (two) times daily as needed (Sedation precautions and avoid alcohol).   promethazine -dextromethorphan (PROMETHAZINE -DM) 6.25-15 MG/5ML syrup Take 5 mLs by mouth 4 (four) times daily as needed for cough. Do not use and drive - May make drowsy.   [DISCONTINUED] venlafaxine  XR (EFFEXOR  XR) 75 MG 24 hr capsule Take 1 capsule (75 mg total) by mouth daily with breakfast.   venlafaxine  XR (EFFEXOR  XR) 150 MG 24 hr capsule Take 1 capsule (150 mg total) by mouth daily with breakfast.   No facility-administered encounter medications on file as of 02/12/2024.    Social History   Tobacco Use   Smoking status: Never   Smokeless tobacco: Never  Substance Use Topics   Alcohol use: Not Currently   Drug use: Not Currently   Family History  Problem Relation Age of Onset   Lung cancer Mother    Bladder Cancer Father    Heart disease Maternal Grandfather    Heart failure Maternal Grandfather        Review of Systems  Constitutional: Negative.   HENT: Negative.    Eyes: Negative.   Respiratory: Negative.    Cardiovascular: Negative.   Gastrointestinal: Negative.   Genitourinary: Negative.   Musculoskeletal:  Negative.   Skin: Negative.   Neurological: Negative.   Endo/Heme/Allergies: Negative.   Psychiatric/Behavioral: Negative.        Objective:     BP (!) 140/92 (BP Location: Right Arm, Patient Position: Sitting, Cuff Size: Large)   Pulse 69   Temp 97.6 F (36.4 C) (Oral)   Resp 16   Wt 249 lb 14.4 oz (113.4 kg)   SpO2 98%   BMI 32.97 kg/m    Physical Exam Constitutional:      General: He is not in acute distress.    Appearance: Normal appearance.     Comments: Muscular.    HENT:     Head: Normocephalic.     Comments: Advised eye doctor and dentist    Right Ear: Tympanic membrane normal.     Left Ear: Tympanic membrane normal.     Nose: Nose normal.     Mouth/Throat:     Pharynx: Oropharynx is clear.  Eyes:     Extraocular Movements: Extraocular movements intact.     Pupils: Pupils are equal, round, and reactive to light.  Neck:     Thyroid: No thyroid mass, thyromegaly or thyroid tenderness.     Vascular: No carotid bruit.     Comments: Thyroid normal to palpation Cardiovascular:     Rate and Rhythm: Normal rate and regular rhythm.  Pulses: Normal pulses.     Heart sounds: Normal heart sounds.  Pulmonary:     Breath sounds: Normal breath sounds.  Abdominal:     General: Bowel sounds are normal. There is no distension.     Palpations: Abdomen is soft. There is no mass.     Tenderness: There is no abdominal tenderness.     Hernia: No hernia is present.  Genitourinary:    Penis: Normal.      Testes: Normal.  Musculoskeletal:        General: No tenderness. Normal range of motion.     Cervical back: Normal range of motion and neck supple. No tenderness.     Right lower leg: No edema.     Left lower leg: No edema.  Lymphadenopathy:     Cervical: No cervical adenopathy.  Skin:    General: Skin is warm and dry.     Comments: Otherwise deferred to his dermatologist.  Numerous atypical nevi on his back.  Neurological:     General: No focal deficit present.      Mental Status: He is alert.  Psychiatric:        Mood and Affect: Mood normal.      No results found for any visits on 02/12/24.    The ASCVD Risk score (Arnett DK, et al., 2019) failed to calculate for the following reasons:   The 2019 ASCVD risk score is only valid for ages 80 to 19    Assessment & Plan:  Wellness examination Assessment & Plan: Commended on healthy habits.  Advised at least annual dermatology visits, especially with the nevi on his back.  Encouraged a little weight loss.  Venlafaxine  increased.  Await labs and f/u with him soon.  Flu shot advised in about 2 weeks.  Will check on his BP soon also.  Orders: -     Comprehensive metabolic panel with GFR -     CBC with Differential/Platelet -     Lipid panel  Anxiety -     Venlafaxine  HCl ER; Take 1 capsule (150 mg total) by mouth daily with breakfast.  Dispense: 30 capsule; Refill: 2  Immunization due -     Tdap vaccine greater than or equal to 7yo IM    No follow-ups on file.    REDDING PONCE NORLEEN FALCON., MD

## 2024-02-13 LAB — COMPREHENSIVE METABOLIC PANEL WITH GFR
ALT: 20 IU/L (ref 0–44)
AST: 16 IU/L (ref 0–40)
Albumin: 4.6 g/dL (ref 4.1–5.1)
Alkaline Phosphatase: 74 IU/L (ref 47–123)
BUN/Creatinine Ratio: 19 (ref 9–20)
BUN: 16 mg/dL (ref 6–20)
Bilirubin Total: 0.5 mg/dL (ref 0.0–1.2)
CO2: 23 mmol/L (ref 20–29)
Calcium: 9.5 mg/dL (ref 8.7–10.2)
Chloride: 103 mmol/L (ref 96–106)
Creatinine, Ser: 0.86 mg/dL (ref 0.76–1.27)
Globulin, Total: 2.1 g/dL (ref 1.5–4.5)
Glucose: 99 mg/dL (ref 70–99)
Potassium: 4.2 mmol/L (ref 3.5–5.2)
Sodium: 139 mmol/L (ref 134–144)
Total Protein: 6.7 g/dL (ref 6.0–8.5)
eGFR: 114 mL/min/1.73 (ref >59)

## 2024-02-13 LAB — CBC WITH DIFFERENTIAL/PLATELET
Basophils Absolute: 0 x10E3/uL (ref 0.0–0.2)
Basos: 0 %
EOS (ABSOLUTE): 0.1 x10E3/uL (ref 0.0–0.4)
Eos: 1 %
Hematocrit: 47 % (ref 37.5–51.0)
Hemoglobin: 15.4 g/dL (ref 13.0–17.7)
Immature Grans (Abs): 0 x10E3/uL (ref 0.0–0.1)
Immature Granulocytes: 0 %
Lymphocytes Absolute: 1.8 x10E3/uL (ref 0.7–3.1)
Lymphs: 24 %
MCH: 30 pg (ref 26.6–33.0)
MCHC: 32.8 g/dL (ref 31.5–35.7)
MCV: 91 fL (ref 79–97)
Monocytes Absolute: 0.4 x10E3/uL (ref 0.1–0.9)
Monocytes: 6 %
Neutrophils Absolute: 5.1 x10E3/uL (ref 1.4–7.0)
Neutrophils: 69 %
Platelets: 219 x10E3/uL (ref 150–450)
RBC: 5.14 x10E6/uL (ref 4.14–5.80)
RDW: 12.4 % (ref 11.6–15.4)
WBC: 7.4 x10E3/uL (ref 3.4–10.8)

## 2024-02-13 LAB — LIPID PANEL
Chol/HDL Ratio: 4 ratio (ref 0.0–5.0)
Cholesterol, Total: 168 mg/dL (ref 100–199)
HDL: 42 mg/dL (ref >39)
LDL Chol Calc (NIH): 109 mg/dL — ABNORMAL HIGH (ref 0–99)
Triglycerides: 90 mg/dL (ref 0–149)
VLDL Cholesterol Cal: 17 mg/dL (ref 5–40)

## 2024-02-15 ENCOUNTER — Encounter (HOSPITAL_BASED_OUTPATIENT_CLINIC_OR_DEPARTMENT_OTHER): Payer: Self-pay | Admitting: *Deleted

## 2024-02-15 ENCOUNTER — Ambulatory Visit (HOSPITAL_BASED_OUTPATIENT_CLINIC_OR_DEPARTMENT_OTHER): Payer: Self-pay | Admitting: Family Medicine

## 2024-03-08 ENCOUNTER — Other Ambulatory Visit (HOSPITAL_BASED_OUTPATIENT_CLINIC_OR_DEPARTMENT_OTHER): Payer: Self-pay

## 2024-03-30 ENCOUNTER — Encounter (HOSPITAL_BASED_OUTPATIENT_CLINIC_OR_DEPARTMENT_OTHER): Payer: Self-pay

## 2024-03-30 ENCOUNTER — Ambulatory Visit (HOSPITAL_BASED_OUTPATIENT_CLINIC_OR_DEPARTMENT_OTHER)
Admission: RE | Admit: 2024-03-30 | Discharge: 2024-03-30 | Disposition: A | Discharge status: Home / Self Care | Source: Ambulatory Visit | Attending: Family Medicine | Admitting: Family Medicine

## 2024-03-30 ENCOUNTER — Other Ambulatory Visit (HOSPITAL_BASED_OUTPATIENT_CLINIC_OR_DEPARTMENT_OTHER): Payer: Self-pay

## 2024-03-30 VITALS — BP 129/81 | HR 103 | Temp 99.1°F | Resp 20

## 2024-03-30 DIAGNOSIS — L03012 Cellulitis of left finger: Secondary | ICD-10-CM

## 2024-03-30 DIAGNOSIS — R519 Headache, unspecified: Secondary | ICD-10-CM | POA: Diagnosis not present

## 2024-03-30 DIAGNOSIS — M542 Cervicalgia: Secondary | ICD-10-CM | POA: Diagnosis not present

## 2024-03-30 DIAGNOSIS — J069 Acute upper respiratory infection, unspecified: Secondary | ICD-10-CM

## 2024-03-30 MED ORDER — KETOROLAC TROMETHAMINE 10 MG PO TABS
10.0000 mg | ORAL_TABLET | Freq: Four times a day (QID) | ORAL | 0 refills | Status: AC | PRN
  Filled 2024-03-30: qty 20, 5d supply, fill #0

## 2024-03-30 MED ORDER — MUPIROCIN 2 % EX OINT
1.0000 | TOPICAL_OINTMENT | Freq: Two times a day (BID) | CUTANEOUS | 0 refills | Status: AC
  Filled 2024-03-30: qty 22, 11d supply, fill #0

## 2024-03-30 MED ORDER — KETOROLAC TROMETHAMINE 30 MG/ML IJ SOLN
30.0000 mg | Freq: Once | INTRAMUSCULAR | Status: AC
Start: 2024-03-30 — End: 2024-03-30
  Administered 2024-03-30: 30 mg via INTRAMUSCULAR

## 2024-03-30 MED ORDER — CYCLOBENZAPRINE HCL 5 MG PO TABS
5.0000 mg | ORAL_TABLET | Freq: Every evening | ORAL | 0 refills | Status: AC | PRN
  Filled 2024-03-30: qty 15, 15d supply, fill #0

## 2024-03-30 NOTE — ED Triage Notes (Signed)
 Pt c/o fever started Saturday, chills, fatigue, he feels dehydrated, pain in neck area that goes up the back of his head to front, and he reports dizziness. He thought he was getting better on Monday but he started back with fever yesterday.

## 2024-03-30 NOTE — ED Provider Notes (Signed)
 [facility]    CSN: [id number] Arrival date & time: 03/30/24  9166      History   Chief Complaint Chief Complaint  Patient presents with   Fever    Entered by patient   Dizziness   Fatigue    HPI SHIHAB STATES is a 38 y.o. male.   38 year old male who reports fever that started on 03/26/2024.  He also had chills and fatigue with some neck pain and frontal sinus pressure or headache.  He has had some dizziness.  He felt like he was getting better and then on 03/29/2024 he felt like he had a fever again.  He denies nausea, vomiting, constipation, diarrhea.  His left thumb has a small round wound that seems to be getting better.  2 days ago it was red and swollen.  It opened and he was able to drain out pus.  He has cleaned it with warm soapy fingers and used Neosporin on it once or twice daily.  He feels like it is getting better.   Fever Associated symptoms: chills and headaches   Associated symptoms: no chest pain, no cough, no diarrhea, no dysuria, no ear pain, no nausea, no rash, no sore throat and no vomiting   Dizziness Associated symptoms: headaches   Associated symptoms: no chest pain, no diarrhea, no nausea, no palpitations and no vomiting     Past Medical History:  Diagnosis Date   Allergies    Anxiety    Asthma    Mild   Overweight     Patient Active Problem List   Diagnosis Date Noted   History of asthma 02/12/2024   History of migraine 02/12/2024   Wellness examination 02/12/2024   Anxiety 01/15/2024   Dyspnea on exertion 11/13/2022   Atypical chest pain 08/05/2019   Pericarditis 08/05/2019    Past Surgical History:  Procedure Laterality Date   HAND SURGERY Left    VASECTOMY         Home Medications    Prior to Admission medications   Medication Sig Start Date End Date Taking? Authorizing Provider  cyclobenzaprine (FLEXERIL) 5 MG tablet Take 1 tablet (5 mg total) by mouth at bedtime as needed for up to 15 days for muscle spasms.  Do not use and drive.  This could make you drowsy.  May take with ketorolac in the evenings if needed. 03/30/24 04/14/24 Yes Ival Domino, FNP  ketorolac (TORADOL) 10 MG tablet Take 1 tablet (10 mg total) by mouth every 6 (six) hours as needed. 03/30/24  Yes Ival Domino, FNP  mupirocin ointment (BACTROBAN) 2 % Apply 1 Application topically 2 (two) times daily. 03/30/24  Yes Ival Domino, FNP  Albuterol -Budesonide  (AIRSUPRA ) 90-80 MCG/ACT AERO Inhale 2 puffs into the lungs every 4 (four) hours as needed (wheezing.  Rinse mouth after use). 01/29/24   Ival Domino, FNP  ALPRAZolam  (XANAX ) 0.25 MG tablet Take 1 tablet (0.25 mg total) by mouth 2 (two) times daily as needed (Sedation precautions and avoid alcohol). 01/15/24   Dottie Norleen PHEBE PONCE, MD  promethazine -dextromethorphan (PROMETHAZINE -DM) 6.25-15 MG/5ML syrup Take 5 mLs by mouth 4 (four) times daily as needed for cough. Do not use and drive - May make drowsy. 01/29/24   Ival Domino, FNP  venlafaxine  XR (EFFEXOR  XR) 150 MG 24 hr capsule Take 1 capsule (150 mg total) by mouth daily with breakfast. 02/12/24   Dottie Norleen PHEBE PONCE, MD    Family History Family History  Problem Relation Age of Onset  Lung cancer Mother    Bladder Cancer Father    Heart disease Maternal Grandfather    Heart failure Maternal Grandfather     Social History Social History   Tobacco Use   Smoking status: Never   Smokeless tobacco: Never  Substance Use Topics   Alcohol use: Not Currently   Drug use: Not Currently     Allergies   Amoxicillin, Doxycycline, and Lexapro [escitalopram]   Review of Systems Review of Systems  Constitutional:  Positive for chills, fatigue and fever.  HENT:  Negative for ear pain and sore throat.   Eyes:  Negative for pain and visual disturbance.  Respiratory:  Negative for cough.   Cardiovascular:  Negative for chest pain and palpitations.  Gastrointestinal:  Negative for abdominal pain, constipation, diarrhea, nausea and  vomiting.  Genitourinary:  Negative for dysuria and hematuria.  Musculoskeletal:  Negative for arthralgias and back pain.  Skin:  Positive for wound (7 mm annular erythematous wound on the dorsum of the left thumb). Negative for color change and rash.  Neurological:  Positive for dizziness and headaches. Negative for seizures and syncope.  All other systems reviewed and are negative.    Physical Exam Triage Vital Signs ED Triage Vitals  Encounter Vitals Group     BP 03/30/24 0839 129/81     Girls Systolic BP Percentile --      Girls Diastolic BP Percentile --      Boys Systolic BP Percentile --      Boys Diastolic BP Percentile --      Pulse Rate 03/30/24 0839 (!) 103     Resp 03/30/24 0839 20     Temp 03/30/24 0839 99.1 F (37.3 C)     Temp Source 03/30/24 0839 Oral     SpO2 03/30/24 0839 95 %     Weight --      Height --      Head Circumference --      Peak Flow --      Pain Score 03/30/24 0837 6     Pain Loc --      Pain Education --      Exclude from Growth Chart --    No data found.  Updated Vital Signs BP 129/81 (BP Location: Right Arm)   Pulse (!) 103   Temp 99.1 F (37.3 C) (Oral)   Resp 20   SpO2 95%   Visual Acuity Right Eye Distance:   Left Eye Distance:   Bilateral Distance:    Right Eye Near:   Left Eye Near:    Bilateral Near:     Physical Exam Vitals and nursing note reviewed.  Constitutional:      General: He is not in acute distress.    Appearance: He is well-developed. He is not ill-appearing, toxic-appearing or diaphoretic.  HENT:     Head: Normocephalic and atraumatic.     Right Ear: Hearing, tympanic membrane, ear canal and external ear normal.     Left Ear: Hearing, tympanic membrane, ear canal and external ear normal.     Nose: No congestion or rhinorrhea.     Right Sinus: Frontal sinus tenderness present. No maxillary sinus tenderness.     Left Sinus: Frontal sinus tenderness present. No maxillary sinus tenderness.     Comments:  Frontal sinus pressure but not significant sinus pain.    Mouth/Throat:     Lips: Pink.     Mouth: Mucous membranes are moist.     Pharynx: Uvula midline.  No oropharyngeal exudate or posterior oropharyngeal erythema.     Tonsils: No tonsillar exudate.  Eyes:     Conjunctiva/sclera: Conjunctivae normal.     Pupils: Pupils are equal, round, and reactive to light.  Cardiovascular:     Rate and Rhythm: Normal rate and regular rhythm.     Heart sounds: S1 normal and S2 normal. No murmur heard. Pulmonary:     Effort: Pulmonary effort is normal. No respiratory distress.     Breath sounds: Normal breath sounds. No decreased breath sounds, wheezing, rhonchi or rales.  Abdominal:     General: Bowel sounds are normal.     Palpations: Abdomen is soft.     Tenderness: There is no abdominal tenderness.  Musculoskeletal:        General: No swelling.     Cervical back: Neck supple. No edema, erythema, signs of trauma, rigidity, torticollis or crepitus. Pain with movement, spinous process tenderness and muscular tenderness present. Normal range of motion.  Lymphadenopathy:     Head:     Right side of head: No submental, submandibular, tonsillar, preauricular or posterior auricular adenopathy.     Left side of head: No submental, submandibular, tonsillar, preauricular or posterior auricular adenopathy.     Cervical: Cervical adenopathy present.     Right cervical: Superficial cervical adenopathy present.     Left cervical: Superficial cervical adenopathy present.  Skin:    General: Skin is warm and dry.     Capillary Refill: Capillary refill takes less than 2 seconds.     Findings: Wound (Wound is less than 1 cm in size, pink, no drainage and nontender.) present. No rash.  Neurological:     Mental Status: He is alert and oriented to person, place, and time.  Psychiatric:        Mood and Affect: Mood normal.      UC Treatments / Results  Labs (all labs ordered are listed, but only abnormal  results are displayed) Labs Reviewed - No data to display  Comprehensive metabolic panel with GFR: 02/12/24:    Component Ref Range & Units (hover) 1 mo ago  Glucose 99  BUN 16  Creatinine, Ser 0.86  eGFR 114  BUN/Creatinine Ratio 19  Sodium 139  Potassium 4.2  Chloride 103  CO2 23  Calcium 9.5  Total Protein 6.7  Albumin 4.6  Globulin, Total 2.1  Bilirubin Total 0.5  Alkaline Phosphatase 74  Comment:               **Please note reference interval change**  AST 16  ALT 20  Resulting Agency [facility]     EKG   Radiology No results found.  Procedures Procedures (including critical care time)  Medications Ordered in UC Medications  ketorolac (TORADOL) 30 MG/ML injection 30 mg (30 mg Intramuscular Given 03/30/24 0936)    Initial Impression / Assessment and Plan / UC Course  I have reviewed the triage vital signs and the nursing notes.  Pertinent labs & imaging results that were available during my care of the patient were reviewed by me and considered in my medical decision making (see chart for details).  Plan of Care: Cervicalgia with non-intractable headache: Vital signs are normal.  Exam is essentially normal with neck pain and frontal sinus pressure.  Ketorolac 30 mg injection now.  Ketorolac 10 mg every 6 hours with food if needed for headache or neck and shoulder pain.  Cyclobenzaprine, 5 mg, nightly for neck and muscle spasms.  Do not use  the cyclobenzaprine and drive.  Could take cyclobenzaprine and ketorolac together if needed.  Get plenty of fluids and rest.  Discussed COVID testing but patient looks nontoxic and has stable vital signs.  He does not have head congestion.  We discussed COVID and flu testing but I do not feel like he would get a big benefit from the testing and he declined that testing.  I do not believe even if he had COVID or flu that it would change the plan of care.  Mild cellulitis of left thumb: Wound is apparently improving and is minimal  with no depth or thickness to it.  Wound is less than 1 cm in size, pink, no drainage and nontender.  Clean the wound once daily with warm soapy fingers, rinse, pat dry, apply mupirocin ointment.  Call or return if wound does not completely resolve within 4 to 7 days.  Encouraged to take 1 to 2 days off and get some rest.  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  Follow-up if symptoms do not improve, worsen or new symptoms occur.  See discharge instruction for signs and symptoms of worsening condition and reasons to go to the emergency room.  For now I feel like he is doing well and does not need to go to an emergency room.  I reviewed the plan of care with the patient and/or the patient's guardian.  The patient and/or guardian had time to ask questions and acknowledged that the questions were answered.  I provided instruction on symptoms or reasons to return here or to go to an ER, if symptoms/condition did not improve, worsened or if new symptoms occurred.  Final Clinical Impressions(s) / UC Diagnoses   Final diagnoses:  Cervicalgia  Nonintractable headache, unspecified chronicity pattern, unspecified headache type  Viral URI  Cellulitis of left thumb     Discharge Instructions      Cervicalgia with non-intractable headache: Vital signs are normal.  Exam is essentially normal with neck pain and frontal sinus pressure.  Ketorolac 30 mg injection now.  Ketorolac 10 mg every 6 hours with food if needed for headache or neck and shoulder pain.  Cyclobenzaprine, 5 mg, nightly for neck and muscle spasms.  Do not use the cyclobenzaprine and drive.  Could take cyclobenzaprine and ketorolac together if needed.  Get plenty of fluids and rest.  Discussed COVID testing but patient looks nontoxic and has stable vital signs.  He does not have head congestion.  We discussed COVID and flu testing but I do not feel like he would get a big benefit from the  testing and he declined that testing.  I do not believe even if he had COVID or flu that it would change the plan of care.  Mild cellulitis of left thumb: Wound is apparently improving and is minimal with no depth or thickness to it.  Wound is less than 1 cm in size, pink, no drainage and nontender.  Clean the wound once daily with warm soapy fingers, rinse, pat dry, apply mupirocin ointment.  Call or return if wound does not completely resolve within 4 to 7 days.  Encouraged to take 1 to 2 days off and get some rest.  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  Follow-up if symptoms do not improve, worsen or new symptoms occur.  See below for signs and symptoms of worsening  condition and reasons to go to the emergency room.    Contact a health care provider if: Your symptoms last for 10 days or longer. Your symptoms get worse over time. You have severe sinus pain in your face or forehead. The glands in your jaw or neck become very swollen. You have shortness of breath. Get help right away if you: You have severe pain. You develop numbness, tingling, or weakness in any part of your body. You cannot move a part of your body (you have paralysis). You have neck pain along with severe dizziness or headache. Feel pain or pressure in your chest. Have trouble breathing. Faint or feel like you will faint. Have severe and persistent vomiting. Feel confused or disoriented. These symptoms may represent a serious problem that is an emergency. Do not wait to see if the symptoms will go away. Get medical help right away. Call your local emergency services (911 in the [place] Do not drive yourself to the hospital.     ED Prescriptions     Medication Sig Dispense Auth. Provider   cyclobenzaprine (FLEXERIL) 5 MG tablet Take 1 tablet (5 mg total) by mouth at bedtime as needed for up to 15 days for muscle spasms. Do not use and drive.  This could make you  drowsy.  May take with ketorolac in the evenings if needed. 15 tablet Tinea Nobile, FNP   ketorolac (TORADOL) 10 MG tablet Take 1 tablet (10 mg total) by mouth every 6 (six) hours as needed. 20 tablet Jaksen Fiorella, FNP   mupirocin ointment (BACTROBAN) 2 % Apply 1 Application topically 2 (two) times daily. 22 g Ival Domino, FNP      PDMP not reviewed this encounter.   Ival Domino, FNP 03/30/24 [phone]

## 2024-03-30 NOTE — Discharge Instructions (Addendum)
 Cervicalgia with non-intractable headache: Vital signs are normal.  Exam is essentially normal with neck pain and frontal sinus pressure.  Ketorolac 30 mg injection now.  Ketorolac 10 mg every 6 hours with food if needed for headache or neck and shoulder pain.  Cyclobenzaprine, 5 mg, nightly for neck and muscle spasms.  Do not use the cyclobenzaprine and drive.  Could take cyclobenzaprine and ketorolac together if needed.  Get plenty of fluids and rest.  Discussed COVID testing but patient looks nontoxic and has stable vital signs.  He does not have head congestion.  We discussed COVID and flu testing but I do not feel like he would get a big benefit from the testing and he declined that testing.  I do not believe even if he had COVID or flu that it would change the plan of care.  Mild cellulitis of left thumb: Wound is apparently improving and is minimal with no depth or thickness to it.  Wound is less than 1 cm in size, pink, no drainage and nontender.  Clean the wound once daily with warm soapy fingers, rinse, pat dry, apply mupirocin ointment.  Call or return if wound does not completely resolve within 4 to 7 days.  Encouraged to take 1 to 2 days off and get some rest.  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  Follow-up if symptoms do not improve, worsen or new symptoms occur.  See below for signs and symptoms of worsening condition and reasons to go to the emergency room.    Contact a health care provider if: Your symptoms last for 10 days or longer. Your symptoms get worse over time. You have severe sinus pain in your face or forehead. The glands in your jaw or neck become very swollen. You have shortness of breath. Get help right away if you: You have severe pain. You develop numbness, tingling, or weakness in any part of your body. You cannot move a part of your body (you have paralysis). You have neck pain along with severe dizziness  or headache. Feel pain or pressure in your chest. Have trouble breathing. Faint or feel like you will faint. Have severe and persistent vomiting. Feel confused or disoriented. These symptoms may represent a serious problem that is an emergency. Do not wait to see if the symptoms will go away. Get medical help right away. Call your local emergency services (911 in the [place] Do not drive yourself to the hospital.

## 2024-04-05 DIAGNOSIS — D225 Melanocytic nevi of trunk: Secondary | ICD-10-CM | POA: Diagnosis not present

## 2024-04-05 DIAGNOSIS — D229 Melanocytic nevi, unspecified: Secondary | ICD-10-CM | POA: Diagnosis not present

## 2024-04-05 DIAGNOSIS — I781 Nevus, non-neoplastic: Secondary | ICD-10-CM | POA: Diagnosis not present

## 2024-04-05 DIAGNOSIS — D235 Other benign neoplasm of skin of trunk: Secondary | ICD-10-CM | POA: Diagnosis not present

## 2024-04-05 DIAGNOSIS — L820 Inflamed seborrheic keratosis: Secondary | ICD-10-CM | POA: Diagnosis not present

## 2024-04-12 ENCOUNTER — Other Ambulatory Visit (HOSPITAL_BASED_OUTPATIENT_CLINIC_OR_DEPARTMENT_OTHER): Payer: Self-pay

## 2024-04-15 ENCOUNTER — Ambulatory Visit (HOSPITAL_BASED_OUTPATIENT_CLINIC_OR_DEPARTMENT_OTHER): Admitting: Family Medicine

## 2024-04-19 ENCOUNTER — Ambulatory Visit (HOSPITAL_BASED_OUTPATIENT_CLINIC_OR_DEPARTMENT_OTHER): Admitting: Family Medicine

## 2024-04-19 ENCOUNTER — Encounter (HOSPITAL_BASED_OUTPATIENT_CLINIC_OR_DEPARTMENT_OTHER): Payer: Self-pay | Admitting: Family Medicine

## 2024-04-19 VITALS — BP 137/88 | HR 68 | Temp 98.0°F | Resp 16 | Wt 245.1 lb

## 2024-04-19 DIAGNOSIS — F419 Anxiety disorder, unspecified: Secondary | ICD-10-CM

## 2024-04-19 DIAGNOSIS — R0789 Other chest pain: Secondary | ICD-10-CM

## 2024-04-19 NOTE — Progress Notes (Signed)
 Established Patient Office Visit  Subjective   Patient ID: Bruce Mckee, male    DOB: Oct 20, 1985  Age: 38 y.o. MRN: [id number]  Chief Complaint  Patient presents with   Follow-up    Follow-up    F/u as above.  Please see last note for details.  He is doing relatively well on the Venlafaxine .  Has been on it for about 3 months and denies any side effects.  I'm a little disappointed that he hasn't improved a bit more dramatically, but he and I agreed that its reasonable to give this some more time.  Occasional nonexertional CP noted, that is clearly stress related.    Past Medical History:  Diagnosis Date   Allergies    Anxiety    Asthma    Mild   Overweight    Skin lesion    f/by dermatology annually per patient    Outpatient Encounter Medications as of 04/19/2024  Medication Sig   Albuterol -Budesonide  (AIRSUPRA ) 90-80 MCG/ACT AERO Inhale 2 puffs into the lungs every 4 (four) hours as needed (wheezing.  Rinse mouth after use).   ALPRAZolam  (XANAX ) 0.25 MG tablet Take 1 tablet (0.25 mg total) by mouth 2 (two) times daily as needed (Sedation precautions and avoid alcohol).   ketorolac  (TORADOL ) 10 MG tablet Take 1 tablet (10 mg total) by mouth every 6 (six) hours as needed.   mupirocin  ointment (BACTROBAN ) 2 % Apply 1 Application topically 2 (two) times daily.   promethazine -dextromethorphan (PROMETHAZINE -DM) 6.25-15 MG/5ML syrup Take 5 mLs by mouth 4 (four) times daily as needed for cough. Do not use and drive - May make drowsy.   venlafaxine  XR (EFFEXOR  XR) 150 MG 24 hr capsule Take 1 capsule (150 mg total) by mouth daily with breakfast.   No facility-administered encounter medications on file as of 04/19/2024.    Social History   Tobacco Use   Smoking status: Never   Smokeless tobacco: Never  Substance Use Topics   Alcohol use: Not Currently   Drug use: Not Currently      Review of Systems  Constitutional:  Negative for malaise/fatigue and weight loss.   Cardiovascular:  Negative for chest pain and palpitations.  Neurological:  Negative for dizziness.  Psychiatric/Behavioral:  Positive for depression. Negative for hallucinations, memory loss, substance abuse and suicidal ideas. The patient is nervous/anxious. The patient does not have insomnia.       Objective:     BP 137/88 (Cuff Size: Normal)   Pulse 68   Temp 98 F (36.7 C) (Oral)   Resp 16   Wt 245 lb 1.6 oz (111.2 kg)   SpO2 97%   BMI 32.34 kg/m    Physical Exam Constitutional:      General: He is not in acute distress.    Appearance: Normal appearance. He is obese.  HENT:     Head: Normocephalic.  Neck:     Vascular: No carotid bruit.     Comments: Thyroid normal to palpation. Cardiovascular:     Rate and Rhythm: Normal rate and regular rhythm.     Pulses: Normal pulses.     Heart sounds: Normal heart sounds.  Pulmonary:     Effort: Pulmonary effort is normal.     Breath sounds: Normal breath sounds.  Abdominal:     General: Bowel sounds are normal.     Palpations: Abdomen is soft.  Musculoskeletal:     Cervical back: Neck supple. No tenderness.     Right lower leg: No  edema.     Left lower leg: No edema.  Neurological:     Mental Status: He is alert.  Psychiatric:        Mood and Affect: Mood normal.        Behavior: Behavior normal.        Thought Content: Thought content normal.        Judgment: Judgment normal.      No results found for any visits on 04/19/24.    The ASCVD Risk score (Arnett DK, et al., 2019) failed to calculate for the following reasons:   The 2019 ASCVD risk score is only valid for ages 44 to 34    Assessment & Plan:  Atypical chest pain Assessment & Plan: Typical anxiety and continue to monitor this.  Obviously alert us  promptly if it becomes exertional.   Anxiety Assessment & Plan: Extended discussion as above.  Reminded him his dosage can be increased up to 225mg  daily if he wishes.     Return in about 6  months (around 10/17/2024) for chronic follow-up.    REDDING PONCE NORLEEN FALCON., MD

## 2024-04-19 NOTE — Assessment & Plan Note (Signed)
 Typical anxiety and continue to monitor this.  Obviously alert us  promptly if it becomes exertional.

## 2024-04-19 NOTE — Assessment & Plan Note (Signed)
 Extended discussion as above.  Reminded him his dosage can be increased up to 225mg  daily if he wishes.

## 2024-05-12 ENCOUNTER — Other Ambulatory Visit (HOSPITAL_BASED_OUTPATIENT_CLINIC_OR_DEPARTMENT_OTHER): Payer: Self-pay | Admitting: Family Medicine

## 2024-05-12 ENCOUNTER — Other Ambulatory Visit (HOSPITAL_BASED_OUTPATIENT_CLINIC_OR_DEPARTMENT_OTHER): Payer: Self-pay

## 2024-05-12 DIAGNOSIS — F419 Anxiety disorder, unspecified: Secondary | ICD-10-CM

## 2024-05-12 MED ORDER — VENLAFAXINE HCL ER 150 MG PO CP24
150.0000 mg | ORAL_CAPSULE | Freq: Every day | ORAL | 1 refills | Status: AC
  Filled 2024-05-12: qty 90, 90d supply, fill #0

## 2024-06-29 ENCOUNTER — Encounter (HOSPITAL_BASED_OUTPATIENT_CLINIC_OR_DEPARTMENT_OTHER): Payer: Self-pay

## 2024-06-29 ENCOUNTER — Ambulatory Visit (HOSPITAL_BASED_OUTPATIENT_CLINIC_OR_DEPARTMENT_OTHER)
Admission: RE | Admit: 2024-06-29 | Discharge: 2024-06-29 | Disposition: A | Discharge status: Home / Self Care | Source: Home / Self Care

## 2024-06-29 ENCOUNTER — Ambulatory Visit (HOSPITAL_BASED_OUTPATIENT_CLINIC_OR_DEPARTMENT_OTHER): Admit: 2024-06-29 | Discharge: 2024-06-29 | Disposition: A | Admitting: Radiology

## 2024-06-29 VITALS — BP 144/95 | HR 71 | Temp 98.3°F | Resp 20

## 2024-06-29 DIAGNOSIS — B070 Plantar wart: Secondary | ICD-10-CM

## 2024-06-29 DIAGNOSIS — M79674 Pain in right toe(s): Secondary | ICD-10-CM

## 2024-06-29 NOTE — Discharge Instructions (Signed)
 I believe this is a plantar wart. I have given some information about this. We can try treating this with salicylic acid. You can get the pads OTC. Soaking the foot before can help.  If this does not work may need to see a armed forces operational officer or podiatrist.

## 2024-06-29 NOTE — ED Triage Notes (Signed)
 Pain to bottom of right great tow x approx 2 weeks. Patient states thought he might have had a splinter. Mother is retired engineer, civil (consulting) and she attempted to dig anything out. Small area of pain. States feels sharp. Now starting to throb.

## 2024-06-29 NOTE — ED Provider Notes (Signed)
 " [facility]    CSN: [id number] Arrival date & time: 06/29/24  1347      History   Chief Complaint Chief Complaint  Patient presents with   Foot Injury    Entered by patient    HPI Bruce Mckee is a 39 y.o. male.   Patient is a 39 year old male who presents today with pain to bottom of right great tow x approx 2 weeks. Patient states thought he might have had a splinter. Mother is retired engineer, civil (consulting) and she attempted to explore the area.  No foreign bodies found.  Small area of pain. States feels sharp. Now starting to throb.  No injuries    Foot Injury   Past Medical History:  Diagnosis Date   Allergies    Anxiety    Asthma    Mild   Overweight    Skin lesion    f/by dermatology annually per patient    Patient Active Problem List   Diagnosis Date Noted   History of asthma 02/12/2024   History of migraine 02/12/2024   Wellness examination 02/12/2024   Anxiety 01/15/2024   Dyspnea on exertion 11/13/2022   Atypical chest pain 08/05/2019   Pericarditis 08/05/2019    Past Surgical History:  Procedure Laterality Date   HAND SURGERY Left    VASECTOMY         Home Medications    Prior to Admission medications  Medication Sig Start Date End Date Taking? Authorizing Provider  Albuterol -Budesonide  (AIRSUPRA ) 90-80 MCG/ACT AERO Inhale 2 puffs into the lungs every 4 (four) hours as needed (wheezing.  Rinse mouth after use). 01/29/24   Ival Domino, FNP  ALPRAZolam  (XANAX ) 0.25 MG tablet Take 1 tablet (0.25 mg total) by mouth 2 (two) times daily as needed (Sedation precautions and avoid alcohol). 01/15/24   Dottie Norleen PHEBE PONCE, MD  ketorolac  (TORADOL ) 10 MG tablet Take 1 tablet (10 mg total) by mouth every 6 (six) hours as needed. 03/30/24   Ival Domino, FNP  mupirocin  ointment (BACTROBAN ) 2 % Apply 1 Application topically 2 (two) times daily. 03/30/24   Ival Domino, FNP  promethazine -dextromethorphan (PROMETHAZINE -DM) 6.25-15 MG/5ML syrup Take 5 mLs by mouth  4 (four) times daily as needed for cough. Do not use and drive - May make drowsy. 01/29/24   Ival Domino, FNP  venlafaxine  XR (EFFEXOR  XR) 150 MG 24 hr capsule Take 1 capsule (150 mg total) by mouth daily with breakfast. 05/12/24   Dottie Norleen PHEBE PONCE, MD    Family History Family History  Problem Relation Age of Onset   Lung cancer Mother    Bladder Cancer Father    Heart disease Maternal Grandfather    Heart failure Maternal Grandfather     Social History Social History[1]   Allergies   Amoxicillin, Doxycycline, and Lexapro [escitalopram]   Review of Systems Review of Systems See HPI  Physical Exam Triage Vital Signs ED Triage Vitals  Encounter Vitals Group     BP 06/29/24 1424 (!) 144/95     Girls Systolic BP Percentile --      Girls Diastolic BP Percentile --      Boys Systolic BP Percentile --      Boys Diastolic BP Percentile --      Pulse Rate 06/29/24 1424 71     Resp 06/29/24 1424 20     Temp 06/29/24 1424 98.3 F (36.8 C)     Temp Source 06/29/24 1424 Oral     SpO2 06/29/24 1424  96 %     Weight --      Height --      Head Circumference --      Peak Flow --      Pain Score 06/29/24 1422 3     Pain Loc --      Pain Education --      Exclude from Growth Chart --    No data found.  Updated Vital Signs BP (!) 144/95 (BP Location: Right Arm)   Pulse 71   Temp 98.3 F (36.8 C) (Oral)   Resp 20   SpO2 96%   Visual Acuity Right Eye Distance:   Left Eye Distance:   Bilateral Distance:    Right Eye Near:   Left Eye Near:    Bilateral Near:     Physical Exam Constitutional:      Appearance: Normal appearance.  Pulmonary:     Effort: Pulmonary effort is normal.  Musculoskeletal:        General: Normal range of motion.  Skin:    General: Skin is warm and dry.     Comments: Plantar wart noted to bottom of right great toe  Neurological:     Mental Status: He is alert.  Psychiatric:        Mood and Affect: Mood normal.      UC Treatments /  Results  Labs (all labs ordered are listed, but only abnormal results are displayed) Labs Reviewed - No data to display  EKG   Radiology DG Toe Great Right Result Date: 06/29/2024 CLINICAL DATA:  Regular rate toe pain for 2 weeks, possibly related to a splinter EXAM: RIGHT GREAT TOE COMPARISON:  None available FINDINGS: Mild degenerative changes of the first metatarsophalangeal joint. No fracture or dislocation of the great toe. No radiopaque foreign body. IMPRESSION: No fracture, dislocation, or radiopaque foreign body of the RIGHT great toe. Electronically Signed   By: Aliene Lloyd M.D.   On: 06/29/2024 14:53    Procedures Procedures (including critical care time)  Medications Ordered in UC Medications - No data to display  Initial Impression / Assessment and Plan / UC Course  I have reviewed the triage vital signs and the nursing notes.  Pertinent labs & imaging results that were available during my care of the patient were reviewed by me and considered in my medical decision making (see chart for details).     Plantar wart- I believe this is a plantar wart. I have given some information about this. We can try treating this with salicylic acid. He can get the pads OTC. Soaking the foot before can help.  If this does not work may need to see a armed forces operational officer or podiatrist. Final Clinical Impressions(s) / UC Diagnoses   Final diagnoses:  Pain of toe of right foot  Plantar wart     Discharge Instructions      I believe this is a plantar wart. I have given some information about this. We can try treating this with salicylic acid. You can get the pads OTC. Soaking the foot before can help.  If this does not work may need to see a armed forces operational officer or podiatrist.     ED Prescriptions   None    PDMP not reviewed this encounter.     [1]  Social History Tobacco Use   Smoking status: Never   Smokeless tobacco: Never  Vaping Use   Vaping status: Never Used  Substance Use  Topics   Alcohol use: Not Currently  Drug use: Not Currently     Adah Wilbert LABOR, FNP 06/30/24 0827  "

## 2024-10-20 ENCOUNTER — Ambulatory Visit (HOSPITAL_BASED_OUTPATIENT_CLINIC_OR_DEPARTMENT_OTHER): Admitting: Family Medicine
